# Patient Record
Sex: Male | Born: 1995
Health system: Southern US, Community
[De-identification: ages and names within clinical notes are randomized; demographics above are authoritative.]

## PROBLEM LIST (undated history)

## (undated) DIAGNOSIS — J302 Other seasonal allergic rhinitis: Secondary | ICD-10-CM

## (undated) DIAGNOSIS — L309 Dermatitis, unspecified: Secondary | ICD-10-CM

## (undated) DIAGNOSIS — F988 Other specified behavioral and emotional disorders with onset usually occurring in childhood and adolescence: Secondary | ICD-10-CM

## (undated) HISTORY — PX: ANKLE SURGERY: SHX546

---
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
## 2016-01-11 ENCOUNTER — Ambulatory Visit: Payer: Self-pay | Admitting: Allergy & Immunology

## 2016-05-02 ENCOUNTER — Emergency Department (HOSPITAL_COMMUNITY)
Admission: EM | Admit: 2016-05-02 | Discharge: 2016-05-02 | Disposition: A | Discharge status: Home / Self Care | Payer: No Typology Code available for payment source | Attending: Emergency Medicine | Admitting: Emergency Medicine

## 2016-05-02 DIAGNOSIS — R112 Nausea with vomiting, unspecified: Secondary | ICD-10-CM | POA: Insufficient documentation

## 2016-05-02 DIAGNOSIS — R0602 Shortness of breath: Secondary | ICD-10-CM | POA: Diagnosis present

## 2016-05-02 DIAGNOSIS — R197 Diarrhea, unspecified: Secondary | ICD-10-CM | POA: Insufficient documentation

## 2016-05-02 DIAGNOSIS — F909 Attention-deficit hyperactivity disorder, unspecified type: Secondary | ICD-10-CM | POA: Diagnosis not present

## 2016-05-02 DIAGNOSIS — J45901 Unspecified asthma with (acute) exacerbation: Secondary | ICD-10-CM | POA: Diagnosis not present

## 2016-05-02 LAB — CBC WITH DIFFERENTIAL/PLATELET
BASOS PCT: 0 %
Basophils Absolute: 0 10*3/uL (ref 0.0–0.1)
EOS ABS: 0 10*3/uL (ref 0.0–0.7)
EOS PCT: 1 %
HCT: 41.9 % (ref 39.0–52.0)
Hemoglobin: 14.8 g/dL (ref 13.0–17.0)
LYMPHS ABS: 0.5 10*3/uL — AB (ref 0.7–4.0)
Lymphocytes Relative: 8 %
MCH: 29.3 pg (ref 26.0–34.0)
MCHC: 35.3 g/dL (ref 30.0–36.0)
MCV: 83 fL (ref 78.0–100.0)
MONO ABS: 0.4 10*3/uL (ref 0.1–1.0)
MONOS PCT: 5 %
Neutro Abs: 5.7 10*3/uL (ref 1.7–7.7)
Neutrophils Relative %: 86 %
PLATELETS: 195 10*3/uL (ref 150–400)
RBC: 5.05 MIL/uL (ref 4.22–5.81)
RDW: 12.4 % (ref 11.5–15.5)
WBC: 6.7 10*3/uL (ref 4.0–10.5)

## 2016-05-02 LAB — COMPREHENSIVE METABOLIC PANEL
ALBUMIN: 4.6 g/dL (ref 3.5–5.0)
ALK PHOS: 86 U/L (ref 38–126)
ALT: 16 U/L — AB (ref 17–63)
AST: 21 U/L (ref 15–41)
Anion gap: 8 (ref 5–15)
BILIRUBIN TOTAL: 1.1 mg/dL (ref 0.3–1.2)
BUN: 17 mg/dL (ref 6–20)
CALCIUM: 9.2 mg/dL (ref 8.9–10.3)
CO2: 26 mmol/L (ref 22–32)
CREATININE: 0.99 mg/dL (ref 0.61–1.24)
Chloride: 104 mmol/L (ref 101–111)
GFR calc Af Amer: >60 mL/min (ref >60)
GFR calc non Af Amer: >60 mL/min (ref >60)
GLUCOSE: 120 mg/dL — AB (ref 65–99)
Potassium: 3.6 mmol/L (ref 3.5–5.1)
SODIUM: 138 mmol/L (ref 135–145)
TOTAL PROTEIN: 8.1 g/dL (ref 6.5–8.1)

## 2016-05-02 LAB — LIPASE, BLOOD: Lipase: 21 U/L (ref 11–51)

## 2016-05-02 MED ORDER — SODIUM CHLORIDE 0.9 % IV BOLUS (SEPSIS)
1000.0000 mL | Freq: Once | INTRAVENOUS | Status: AC
  Administered 2016-05-02: 1000 mL via INTRAVENOUS

## 2016-05-02 MED ORDER — IPRATROPIUM-ALBUTEROL 0.5-2.5 (3) MG/3ML IN SOLN
3.0000 mL | Freq: Once | RESPIRATORY_TRACT | Status: AC
  Administered 2016-05-02: 3 mL via RESPIRATORY_TRACT
  Filled 2016-05-02: qty 3

## 2016-05-02 MED ORDER — ALBUTEROL SULFATE HFA 108 (90 BASE) MCG/ACT IN AERS: 2.0000 | INHALATION_SPRAY | RESPIRATORY_TRACT | 0 refills | Status: DC | PRN

## 2016-05-02 MED ORDER — ACETAMINOPHEN 500 MG PO TABS
1000.0000 mg | ORAL_TABLET | Freq: Once | ORAL | Status: AC
  Administered 2016-05-02: 1000 mg via ORAL
  Filled 2016-05-02: qty 2

## 2016-05-02 MED ORDER — PREDNISONE 20 MG PO TABS
40.0000 mg | ORAL_TABLET | Freq: Every day | ORAL | 0 refills | Status: AC
Start: 2016-05-03 — End: 2016-05-07

## 2016-05-02 MED ORDER — ONDANSETRON HCL 4 MG PO TABS: 4.0000 mg | ORAL_TABLET | Freq: Four times a day (QID) | ORAL | 0 refills | Status: DC

## 2016-05-02 NOTE — Discharge Instructions (Signed)
We believe that your symptoms are caused today by an exacerbation of your asthma.  Please take the prescribed medications and any medications that you have at home.  Follow up with your doctor as recommended.  If you develop any new or worsening symptoms, including but not limited to fever, persistent vomiting, worsening shortness of breath, or other symptoms that concern you, please return to the Emergency Department immediately.  

## 2016-05-02 NOTE — ED Provider Notes (Signed)
Emergency Department Provider Note   I have reviewed the triage vital signs and the nursing notes.   HISTORY  Chief Complaint Shortness of Breath   HPI Eric Mills is a 20 y.o. male with PMH of asthma presents to the emergency department for evaluation of acute onset difficulty breathing. Patient states he was up most of the night with vomiting and diarrhea. During one episode of vomiting patient states he had difficulty breathing all of a sudden and eventually was able to get his breath. He states he felt like he almost passed out. No chest pain. No fevers. She has multiple sick contacts at work with similar GI illness symptoms. The patient called EMS since he was out of his albuterol inhalers and in acute respiratory distress. He was given Solu-Medrol, Zofran, albuterol in route and is feeling better.   Past Medical History:  Diagnosis Date  . Asthma   . Attention deficit disorder (ADD)     There are no active problems to display for this patient.   No past surgical history on file.  Current Outpatient Rx  . Order #: [id number] Class: Print  . Order #: [id number] Class: Print  . Order #: [id number] Class: Print  . Order #: [id number] Class: Print  . [START ON 05/03/2016] Order #: [id number] Class: Print    Allergies Fish allergy and Milk-related compounds  No family history on file.  Social History Social History  Substance Use Topics  . Smoking status: Never Smoker  . Smokeless tobacco: Not on file  . Alcohol use No    Review of Systems  Constitutional: No fever/chills Eyes: No visual changes. ENT: No sore throat. Cardiovascular: Denies chest pain. Respiratory: Denies shortness of breath. Gastrointestinal: No abdominal pain. Positive nausea, vomiting, and diarrhea.  No constipation. Genitourinary: Negative for dysuria. Musculoskeletal: Negative for back pain. Skin: Negative for rash. Neurological: Negative for headaches, focal weakness or  numbness.  10-point ROS otherwise negative.  ____________________________________________   PHYSICAL EXAM:  VITAL SIGNS: ED Triage Vitals [05/02/16 1114]  Enc Vitals Group     BP 143/72     Pulse Rate 104     Resp 18     Temp 98.4 F (36.9 C)     Temp Source Oral     SpO2 99 %   Constitutional: Alert and oriented. Well appearing and in no acute distress. Eyes: Conjunctivae are normal.  Head: Atraumatic. Nose: No congestion/rhinnorhea. Mouth/Throat: Mucous membranes are moist.  Neck: No stridor.   Cardiovascular: Normal rate, regular rhythm. Good peripheral circulation. Grossly normal heart sounds.   Respiratory: Normal respiratory effort.  No retractions. Lungs with faint expiratory wheezing throughout.  Gastrointestinal: Soft and nontender. No distention.  Musculoskeletal: No lower extremity tenderness nor edema. No gross deformities of extremities. Neurologic:  Normal speech and language. No gross focal neurologic deficits are appreciated.  Skin:  Skin is warm, dry and intact. No rash noted.  ____________________________________________   LABS (all labs ordered are listed, but only abnormal results are displayed)  Labs Reviewed  COMPREHENSIVE METABOLIC PANEL - Abnormal; Notable for the following:       Result Value   Glucose, Bld 120 (*)    ALT 16 (*)    All other components within normal limits  CBC WITH DIFFERENTIAL/PLATELET - Abnormal; Notable for the following:    Lymphs Abs 0.5 (*)    All other components within normal limits  LIPASE, BLOOD   ____________________________________________   PROCEDURES  Procedure(s) performed:   Procedures  None ____________________________________________   INITIAL  IMPRESSION / ASSESSMENT AND PLAN / ED COURSE  Pertinent labs & imaging results that were available during my care of the patient were reviewed by me and considered in my medical decision making (see chart for details).  Patient resents to the  emergency department for evaluation of difficulty breathing in the setting of large volume vomiting and diarrhea. Patient has a history of asthma and requires several hospital presentations per year. No recent admissions. Patient seems more comfortable after DuoNeb, Solu-Medrol, Zofran with EMS. He is asking to drink something. Patient has some mild tachycardia that is likely due to the albuterol nebulizer. Plan for repeat urine abdomen, labs, PO challenge.   Labs unremarkable. Patient tolerating PO without difficulty. Wheezing and subjective SOB symptoms improved. No clinical evidence to suggest PNA at this time. Plan for discharge home with zofran, steroids, and albuterol. Patient will follow with PCP.   At this time, I do not feel there is any life-threatening condition present. I have reviewed and discussed all results (EKG, imaging, lab, urine as appropriate), exam findings with patient. I have reviewed nursing notes and appropriate previous records.  I feel the patient is safe to be discharged home without further emergent workup. Discussed usual and customary return precautions. Patient and family (if present) verbalize understanding and are comfortable with this plan.  Patient will follow-up with their primary care provider. If they do not have a primary care provider, information for follow-up has been provided to them. All questions have been answered.  ____________________________________________  FINAL CLINICAL IMPRESSION(S) / ED DIAGNOSES  Final diagnoses:  Moderate asthma with exacerbation, unspecified whether persistent  Nausea, vomiting and diarrhea     MEDICATIONS GIVEN DURING THIS VISIT:  Medications  sodium chloride 0.9 % bolus 1,000 mL (0 mLs Intravenous Stopped 05/02/16 1432)  ipratropium-albuterol (DUONEB) 0.5-2.5 (3) MG/3ML nebulizer solution 3 mL (3 mLs Nebulization Given 05/02/16 1249)  acetaminophen (TYLENOL) tablet 1,000 mg (1,000 mg Oral Given 05/02/16 1439)      NEW OUTPATIENT MEDICATIONS STARTED DURING THIS VISIT:  Discharge Medication List as of 05/02/2016  2:32 PM    START taking these medications   Details  !! albuterol (PROVENTIL HFA;VENTOLIN HFA) 108 (90 Base) MCG/ACT inhaler Inhale 2 puffs into the lungs every 4 (four) hours as needed for wheezing or shortness of breath., Starting Thu 05/02/2016, Print    ondansetron (ZOFRAN) 4 MG tablet Take 1 tablet (4 mg total) by mouth every 6 (six) hours., Starting Thu 05/02/2016, Print    predniSONE (DELTASONE) 20 MG tablet Take 2 tablets (40 mg total) by mouth daily., Starting Fri 05/03/2016, Until Tue 05/07/2016, Print     !! - Potential duplicate medications found. Please discuss with provider.        Note:  This document was prepared using Dragon voice recognition software and may include unintentional dictation errors.  Alona BeneJoshua Davaughn Hillyard, MD Emergency Medicine   Maia PlanJoshua G Tsuyako Jolley, MD 05/02/16 1728

## 2016-05-02 NOTE — ED Notes (Signed)
Bed: WA06 Expected date:  Expected time:  Means of arrival:  Comments: EMS- 20yo M, SOB/n/v/Tx given

## 2016-05-02 NOTE — ED Triage Notes (Signed)
Per EMS, pt is coming from home with complaints of an asthma attack. Pt has the stomach virus and has had n/v/d for the last 24 hours. On arrival Pt was laying on right side having labored breathing and hard time completing sentences. Pt has a hx of asthma. 125 mg solu-medrol, 1 duo-neb, and 4 mg of zofran.

## 2016-05-02 NOTE — ED Notes (Signed)
Bed: WA07 Expected date:  Expected time:  Means of arrival:  Comments: 

## 2016-05-10 ENCOUNTER — Ambulatory Visit: Payer: Self-pay | Admitting: Allergy & Immunology

## 2016-07-14 ENCOUNTER — Emergency Department (HOSPITAL_COMMUNITY)
Admission: EM | Admit: 2016-07-14 | Discharge: 2016-07-14 | Disposition: A | Discharge status: Home / Self Care | Payer: No Typology Code available for payment source | Attending: Emergency Medicine | Admitting: Emergency Medicine

## 2016-07-14 ENCOUNTER — Emergency Department (HOSPITAL_COMMUNITY): Payer: No Typology Code available for payment source

## 2016-07-14 ENCOUNTER — Encounter (HOSPITAL_COMMUNITY): Payer: Self-pay

## 2016-07-14 DIAGNOSIS — F909 Attention-deficit hyperactivity disorder, unspecified type: Secondary | ICD-10-CM | POA: Insufficient documentation

## 2016-07-14 DIAGNOSIS — J45901 Unspecified asthma with (acute) exacerbation: Secondary | ICD-10-CM | POA: Insufficient documentation

## 2016-07-14 DIAGNOSIS — R05 Cough: Secondary | ICD-10-CM | POA: Insufficient documentation

## 2016-07-14 DIAGNOSIS — Z79899 Other long term (current) drug therapy: Secondary | ICD-10-CM | POA: Insufficient documentation

## 2016-07-14 DIAGNOSIS — R059 Cough, unspecified: Secondary | ICD-10-CM

## 2016-07-14 MED ORDER — IPRATROPIUM-ALBUTEROL 0.5-2.5 (3) MG/3ML IN SOLN
3.0000 mL | Freq: Once | RESPIRATORY_TRACT | Status: AC
  Administered 2016-07-14: 3 mL via RESPIRATORY_TRACT
  Filled 2016-07-14: qty 3

## 2016-07-14 MED ORDER — ALBUTEROL SULFATE HFA 108 (90 BASE) MCG/ACT IN AERS: 1.0000 | INHALATION_SPRAY | Freq: Four times a day (QID) | RESPIRATORY_TRACT | 0 refills | Status: DC | PRN

## 2016-07-14 MED ORDER — ALBUTEROL SULFATE (2.5 MG/3ML) 0.083% IN NEBU
5.0000 mg | INHALATION_SOLUTION | Freq: Once | RESPIRATORY_TRACT | Status: AC
  Administered 2016-07-14: 5 mg via RESPIRATORY_TRACT

## 2016-07-14 MED ORDER — ALBUTEROL SULFATE (2.5 MG/3ML) 0.083% IN NEBU
INHALATION_SOLUTION | RESPIRATORY_TRACT | Status: AC
  Filled 2016-07-14: qty 6

## 2016-07-14 MED ORDER — PREDNISONE 20 MG PO TABS
60.0000 mg | ORAL_TABLET | Freq: Once | ORAL | Status: AC
  Administered 2016-07-14: 60 mg via ORAL
  Filled 2016-07-14: qty 3

## 2016-07-14 MED ORDER — ALBUTEROL SULFATE (2.5 MG/3ML) 0.083% IN NEBU: 2.5000 mg | INHALATION_SOLUTION | Freq: Four times a day (QID) | RESPIRATORY_TRACT | 12 refills | Status: DC | PRN

## 2016-07-14 MED ORDER — PREDNISONE 20 MG PO TABS: 40.0000 mg | ORAL_TABLET | Freq: Every day | ORAL | 0 refills | Status: DC

## 2016-07-14 NOTE — ED Provider Notes (Signed)
[facility]PT Provider Note   CSN: [id number] Arrival date & time: 07/14/16  4098     History   Chief Complaint Chief Complaint  Patient presents with  . asthma exacerbation    HPI Eric Mills is a 21 y.o. male.  The history is provided by the patient and medical records. No language interpreter was used.   Eric Mills is a 21 y.o. male  with a PMH of asthma who presents to the Emergency Department complaining of chest tightness 3-4 days associated with dry cough and nasal congestion. He has been using his Symbicort inhaler daily as well as rescue inhaler multiple times throughout the day with little relief in symptoms. Typically uses home nebulizer when he feels this way, however he is out of albuterol nebs. Ibuprofen taken as needed for chest pain with some relief. 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 Denies fever or chills.    Past Medical History:  Diagnosis Date  . Asthma   . Attention deficit disorder (ADD)     There are no active problems to display for this patient.   History reviewed. No pertinent surgical history.     Home Medications    Prior to Admission medications   Medication Sig Start Date End Date Taking? Authorizing Provider  albuterol (PROVENTIL HFA;VENTOLIN HFA) 108 (90 Base) MCG/ACT inhaler Inhale 1-2 puffs into the lungs every 6 (six) hours as needed for wheezing or shortness of breath. 07/14/16   Chase Picket Shanitra Phillippi, PA-C  albuterol (PROVENTIL) (2.5 MG/3ML) 0.083% nebulizer solution Take 3 mLs (2.5 mg total) by nebulization every 6 (six) hours as needed for wheezing or shortness of breath. 07/14/16   Perrin Gens Pilcher Monte Zinni, PA-C  budesonide-formoterol (SYMBICORT) 80-4.5 MCG/ACT inhaler Inhale 2 puffs into the lungs 2 (two) times daily. Patient not taking: Reported on 05/02/2016 11/02/15   Marily Memos, MD  ondansetron (ZOFRAN) 4 MG tablet Take 1 tablet (4 mg total) by mouth every 6  (six) hours. 05/02/16   Maia Plan, MD  predniSONE (DELTASONE) 20 MG tablet Take 2 tablets (40 mg total) by mouth daily. 07/14/16   Chase Picket Darral Rishel, PA-C    Family History No family history on file.  Social History Social History  Substance Use Topics  . Smoking status: Never Smoker  . Smokeless tobacco: Not on file  . Alcohol use No     Allergies   Fish allergy and Milk-related compounds   Review of Systems Review of Systems  Constitutional: Negative for chills and fever.  HENT: Positive for congestion.   Eyes: Negative for visual disturbance.  Respiratory: Positive for cough, chest tightness and wheezing. Negative for shortness of breath.   Cardiovascular: Negative.   Gastrointestinal: Negative for abdominal pain, nausea and vomiting.  Genitourinary: Negative for dysuria.  Musculoskeletal: Negative for back pain and neck pain.  Skin: Negative for rash.  Neurological: Negative for headaches.     Physical Exam Updated Vital Signs BP 121/57   Pulse (!) 52   Temp 97.8 F (36.6 C) (Oral)   Resp 20   SpO2 100%   Physical Exam  Constitutional: He is oriented to person, place, and time. He appears well-developed and well-nourished. No distress.  HENT:  Head: Normocephalic and atraumatic.  Cardiovascular: Normal rate, regular rhythm and normal heart sounds.   No murmur heard. Pulmonary/Chest: Effort normal. No respiratory distress. He has wheezes.  Expiratory wheezing bilaterally. Speaking in full sentences. 100% O2  on RA.   Abdominal: Soft. He exhibits no distension. There is no tenderness.  Musculoskeletal: He exhibits no edema.  Neurological: He is alert and oriented to person, place, and time.  Skin: Skin is warm and dry.  Nursing note and vitals reviewed.    ED Treatments / Results  Labs (all labs ordered are listed, but only abnormal results are displayed) Labs Reviewed - No data to display  EKG  EKG Interpretation None       Radiology Dg  Chest 2 View  Result Date: 07/14/2016 CLINICAL DATA:  Cough and congestion with chest tightness for 3 days. History of asthma. EXAM: CHEST  2 VIEW COMPARISON:  None. FINDINGS: The cardiomediastinal silhouette is unremarkable. Mild peribronchial thickening is unchanged. There is no evidence of focal airspace disease, pulmonary edema, suspicious pulmonary nodule/mass, pleural effusion, or pneumothorax. No acute bony abnormalities are identified. IMPRESSION: No active cardiopulmonary disease. Electronically Signed   By: Harmon PierJeffrey  Hu M.D.   On: 07/14/2016 11:07    Procedures Procedures (including critical care time)  Medications Ordered in ED Medications  albuterol (PROVENTIL) (2.5 MG/3ML) 0.083% nebulizer solution (not administered)  albuterol (PROVENTIL) (2.5 MG/3ML) 0.083% nebulizer solution 5 mg (5 mg Nebulization Given 07/14/16 0935)  ipratropium-albuterol (DUONEB) 0.5-2.5 (3) MG/3ML nebulizer solution 3 mL (3 mLs Nebulization Given 07/14/16 1124)  predniSONE (DELTASONE) tablet 60 mg (60 mg Oral Given 07/14/16 1124)     Initial Impression / Assessment and Plan / ED Course  I have reviewed the triage vital signs and the nursing notes.  Pertinent labs & imaging results that were available during my care of the patient were reviewed by me and considered in my medical decision making (see chart for details).    Eric Mills is a 21 y.o. male who presents to ED for chest tightness, cough, congestion c/w asthma exacerbation. On exam, patient is afebrile, speaking in full sentences with expiratory wheezing bilaterally. One duoneb given in triage. Will obtain CXR and give duoneb/steroids then reassess.   CXR negative.  11:53 AM - Patient reevaluated following second nebulizer treatment. Wheezing nearly resolved. Patient feels much improved and comfortable with discharge to home. Will refill albuterol nebs and rescue inhaler. Rx for steroid burst. Return precautions discussed with patient and mother  at bedside. PCP follow up if symptoms persist. All questions answered.    Final Clinical Impressions(s) / ED Diagnoses   Final diagnoses:  Cough  Moderate asthma with acute exacerbation, unspecified whether persistent    New Prescriptions New Prescriptions   ALBUTEROL (PROVENTIL HFA;VENTOLIN HFA) 108 (90 BASE) MCG/ACT INHALER    Inhale 1-2 puffs into the lungs every 6 (six) hours as needed for wheezing or shortness of breath.   ALBUTEROL (PROVENTIL) (2.5 MG/3ML) 0.083% NEBULIZER SOLUTION    Take 3 mLs (2.5 mg total) by nebulization every 6 (six) hours as needed for wheezing or shortness of breath.   PREDNISONE (DELTASONE) 20 MG TABLET    Take 2 tablets (40 mg total) by mouth daily.     [facility] County Health CenterJaime Pilcher Evelin Cake, PA-C 07/14/16 1204    Tilden FossaElizabeth Rees, MD 07/20/16 [phone]

## 2016-07-14 NOTE — Discharge Instructions (Signed)
Take prednisone daily starting tomorrow morning. Continue Symbicort inhaler daily. I have refilled your rescue inhaler and nebulizer treatments. Use every 4 hours as needed. Follow-up with your primary care provider if symptoms do not improve. Return to ER for new or worsening symptoms, any additional concerns.

## 2016-07-14 NOTE — ED Triage Notes (Signed)
Patient complains of asthma exacerbation x 3 days. Increased cough and congestion with chest tightness, last used inhaler last night. Speaking complete sentences

## 2016-08-07 ENCOUNTER — Encounter (HOSPITAL_COMMUNITY): Payer: Self-pay

## 2016-08-07 ENCOUNTER — Emergency Department (HOSPITAL_COMMUNITY)
Admission: EM | Admit: 2016-08-07 | Discharge: 2016-08-07 | Disposition: A | Discharge status: Home / Self Care | Payer: No Typology Code available for payment source | Attending: Emergency Medicine | Admitting: Emergency Medicine

## 2016-08-07 DIAGNOSIS — J302 Other seasonal allergic rhinitis: Secondary | ICD-10-CM

## 2016-08-07 DIAGNOSIS — J3089 Other allergic rhinitis: Secondary | ICD-10-CM | POA: Insufficient documentation

## 2016-08-07 DIAGNOSIS — Z79899 Other long term (current) drug therapy: Secondary | ICD-10-CM | POA: Insufficient documentation

## 2016-08-07 DIAGNOSIS — R0981 Nasal congestion: Secondary | ICD-10-CM

## 2016-08-07 MED ORDER — ALBUTEROL SULFATE HFA 108 (90 BASE) MCG/ACT IN AERS
2.0000 | INHALATION_SPRAY | Freq: Once | RESPIRATORY_TRACT | Status: AC
Start: 2016-08-07 — End: 2016-08-07
  Administered 2016-08-07: 2 via RESPIRATORY_TRACT
  Filled 2016-08-07: qty 6.7

## 2016-08-07 MED ORDER — DEXAMETHASONE 4 MG PO TABS
10.0000 mg | ORAL_TABLET | Freq: Once | ORAL | Status: AC
  Administered 2016-08-07: 10 mg via ORAL
  Filled 2016-08-07: qty 3

## 2016-08-07 NOTE — ED Triage Notes (Signed)
Patient complains of congestion and cough x 1 day, states that he has seasonal allergies and using inhaler for asthma. No wheezing, no distress on arrival

## 2016-08-07 NOTE — ED Notes (Signed)
Patient complaining of nasal congestion with sore throat.  States that he feels like his asthma is acting up.  Lungs clear.

## 2016-08-07 NOTE — ED Provider Notes (Signed)
[facility] Provider Note   CSN: [id number] Arrival date & time: 08/07/16  1023  By signing my name below, I, [name], attest that this documentation has been prepared under the direction and in the presence of Nira Conn, MD . Electronically Signed: [name], ED Scribe. 08/07/2016. 12:37 PM.   History   Chief Complaint Chief Complaint  Patient presents with  . Nasal Congestion   The history is provided by the patient and medical records. No language interpreter was used.    HPI Comments:  Eric Mills is a 21 y.o. male who presents to the Emergency Department complaining of constant nasal congestion x 2 days. Pt complains of associated SOB, dry cough, sore throat. Pt reports seasonal allergies, and states that he has been using 2 doses of an albuterol inhaler for asthma every 2 hours with mild relief. Pt is prescribed prednisone but has not filled the prescription because his medicaid ended. He is requesting a refill for his inhaler. Pt reports possible sick contacts. Denies wheezing, fever.    Past Medical History:  Diagnosis Date  . Asthma   . Attention deficit disorder (ADD)    There are no active problems to display for this patient.  History reviewed. No pertinent surgical history.  Home Medications    Prior to Admission medications   Medication Sig Start Date End Date Taking? Authorizing Provider  albuterol (PROVENTIL HFA;VENTOLIN HFA) 108 (90 Base) MCG/ACT inhaler Inhale 1-2 puffs into the lungs every 6 (six) hours as needed for wheezing or shortness of breath. 07/14/16   Chase Picket Ward, PA-C  albuterol (PROVENTIL) (2.5 MG/3ML) 0.083% nebulizer solution Take 3 mLs (2.5 mg total) by nebulization every 6 (six) hours as needed for wheezing or shortness of breath. 07/14/16   Jaime Pilcher Ward, PA-C  budesonide-formoterol (SYMBICORT) 80-4.5 MCG/ACT inhaler Inhale 2 puffs into the lungs 2 (two) times daily. Patient not taking: Reported  on 05/02/2016 11/02/15   Marily Memos, MD  ondansetron (ZOFRAN) 4 MG tablet Take 1 tablet (4 mg total) by mouth every 6 (six) hours. 05/02/16   Maia Plan, MD  predniSONE (DELTASONE) 20 MG tablet Take 2 tablets (40 mg total) by mouth daily. 07/14/16   Chase Picket Ward, PA-C   Family History No family history on file.  Social History Social History  Substance Use Topics  . Smoking status: Never Smoker  . Smokeless tobacco: Not on file  . Alcohol use No   Allergies   Fish allergy and Milk-related compounds   Review of Systems Review of Systems  10 Systems reviewed and are negative for acute change except as noted in the HPI.    Physical Exam Updated Vital Signs BP 140/71   Pulse (!) 56   Temp 97.9 F (36.6 C)   Resp 18   SpO2 99%   Physical Exam  Constitutional: He is oriented to person, place, and time. He appears well-developed and well-nourished. No distress.  HENT:  Head: Normocephalic and atraumatic.  Right Ear: Tympanic membrane normal.  Left Ear: Tympanic membrane normal.  Nose: Nose normal.  Mouth/Throat: No posterior oropharyngeal erythema. No tonsillar exudate.  Nasal congestion bilaterally, and post-nasal drip noted.   Eyes: Conjunctivae and EOM are normal. Pupils are equal, round, and reactive to light. Right eye exhibits no discharge. Left eye exhibits no discharge. No scleral icterus.  Neck: Normal range of motion. Neck supple.  Cardiovascular: Normal rate and regular rhythm.  Exam reveals no gallop and no friction rub.  No murmur heard. Pulmonary/Chest: Effort normal and breath sounds normal. No stridor. No respiratory distress. He has no wheezes. He has no rales.  Abdominal: Soft. He exhibits no distension. There is no tenderness.  Musculoskeletal: He exhibits no edema or tenderness.  Neurological: He is alert and oriented to person, place, and time.  Skin: Skin is warm and dry. No rash noted. He is not diaphoretic. No erythema.  Psychiatric: He  has a normal mood and affect.  Vitals reviewed.     ED Treatments / Results  DIAGNOSTIC STUDIES:  Oxygen Saturation is 99% on RA, normal by my interpretation.    COORDINATION OF CARE:  12:35 PM Will order steroid injection. Discussed treatment plan with pt at bedside and pt agreed to plan.  Labs (all labs ordered are listed, but only abnormal results are displayed) Labs Reviewed - No data to display  EKG  EKG Interpretation None       Radiology No results found.  Procedures Procedures (including critical care time)  Medications Ordered in ED Medications  albuterol (PROVENTIL HFA;VENTOLIN HFA) 108 (90 Base) MCG/ACT inhaler 2 puff (not administered)  dexamethasone (DECADRON) tablet 10 mg (not administered)     Initial Impression / Assessment and Plan / ED Course  I have reviewed the triage vital signs and the nursing notes.  Pertinent labs & imaging results that were available during my care of the patient were reviewed by me and considered in my medical decision making (see chart for details).     Consistent with likely allergic rhinitis. No wheezing, increased WOB, or decreased air movement noted to suggest asthma exacerbation. Given his reported increased use of albuterol, will provide patient with 10 mg of Decadron. We'll also provide patient with albuterol inhaler and he is running out. Low suspicion for infectious source. No indication of CXR at this time.  The patient is safe for discharge with strict return precautions.   Final Clinical Impressions(s) / ED Diagnoses   Final diagnoses:  Acute seasonal allergic rhinitis due to other allergen  Nasal congestion   Disposition: Discharge  Condition: Good  I have discussed the results, Dx and Tx plan with the patient who expressed understanding and agree(s) with the plan. Discharge instructions discussed at great length. The patient was given strict return precautions who verbalized understanding of the  instructions. No further questions at time of discharge.    New Prescriptions   No medications on file    Follow Up: Rosario Adie, MD [phone]  Schedule an appointment as soon as possible for a visit  As needed   I personally performed the services described in this documentation, which was scribed in my presence. The recorded information has been reviewed and is accurate.        Nira Conn, MD 08/07/16 1248

## 2016-09-28 ENCOUNTER — Emergency Department (HOSPITAL_COMMUNITY): Payer: Medicaid Other

## 2016-09-28 ENCOUNTER — Encounter (HOSPITAL_COMMUNITY): Payer: Self-pay

## 2016-09-28 ENCOUNTER — Emergency Department (HOSPITAL_COMMUNITY)
Admission: EM | Admit: 2016-09-28 | Discharge: 2016-09-28 | Disposition: A | Discharge status: Home / Self Care | Payer: Medicaid Other | Attending: Emergency Medicine | Admitting: Emergency Medicine

## 2016-09-28 DIAGNOSIS — X509XXA Other and unspecified overexertion or strenuous movements or postures, initial encounter: Secondary | ICD-10-CM | POA: Insufficient documentation

## 2016-09-28 DIAGNOSIS — Y9367 Activity, basketball: Secondary | ICD-10-CM | POA: Insufficient documentation

## 2016-09-28 DIAGNOSIS — Z79899 Other long term (current) drug therapy: Secondary | ICD-10-CM | POA: Insufficient documentation

## 2016-09-28 DIAGNOSIS — F909 Attention-deficit hyperactivity disorder, unspecified type: Secondary | ICD-10-CM | POA: Insufficient documentation

## 2016-09-28 DIAGNOSIS — S93491A Sprain of other ligament of right ankle, initial encounter: Secondary | ICD-10-CM

## 2016-09-28 DIAGNOSIS — Y998 Other external cause status: Secondary | ICD-10-CM | POA: Insufficient documentation

## 2016-09-28 DIAGNOSIS — J45909 Unspecified asthma, uncomplicated: Secondary | ICD-10-CM | POA: Insufficient documentation

## 2016-09-28 DIAGNOSIS — Y929 Unspecified place or not applicable: Secondary | ICD-10-CM | POA: Insufficient documentation

## 2016-09-28 MED ORDER — ACETAMINOPHEN 500 MG PO TABS
1000.0000 mg | ORAL_TABLET | Freq: Once | ORAL | Status: AC
  Administered 2016-09-28: 1000 mg via ORAL
  Filled 2016-09-28: qty 2

## 2016-09-28 MED ORDER — IBUPROFEN 400 MG PO TABS
600.0000 mg | ORAL_TABLET | Freq: Once | ORAL | Status: AC
  Administered 2016-09-28: 600 mg via ORAL
  Filled 2016-09-28: qty 1

## 2016-09-28 NOTE — ED Provider Notes (Signed)
[facility] Provider Note   CSN: [id number] Arrival date & time: 09/28/16  1943     History   Chief Complaint Chief Complaint  Patient presents with  . Ankle Pain    HPI Eric Mills is a 21 y.o. male.  Patient reports that yesterday he was playing basketball. Forcefully stepped on his right ankle. Immediate onset of pain and ankle as well as middle of his lower leg. Persistence of pain so presented here. Not fall and hit his head, did not lose consciousness. No other pain at this time.   The history is provided by the patient.  Illness  This is a new problem. The current episode started yesterday. The problem occurs constantly. The problem has not changed since onset.Pertinent negatives include no chest pain, no abdominal pain, no headaches and no shortness of breath. He has tried nothing for the symptoms.    Past Medical History:  Diagnosis Date  . Asthma   . Attention deficit disorder (ADD)     There are no active problems to display for this patient.   Past Surgical History:  Procedure Laterality Date  . ANKLE SURGERY         Home Medications    Prior to Admission medications   Medication Sig Start Date End Date Taking? Authorizing Provider  albuterol (PROVENTIL HFA;VENTOLIN HFA) 108 (90 Base) MCG/ACT inhaler Inhale 1-2 puffs into the lungs every 6 (six) hours as needed for wheezing or shortness of breath. 07/14/16   Ward, Chase Picket, PA-C  albuterol (PROVENTIL) (2.5 MG/3ML) 0.083% nebulizer solution Take 3 mLs (2.5 mg total) by nebulization every 6 (six) hours as needed for wheezing or shortness of breath. 07/14/16   Ward, Chase Picket, PA-C  budesonide-formoterol (SYMBICORT) 80-4.5 MCG/ACT inhaler Inhale 2 puffs into the lungs 2 (two) times daily. Patient not taking: Reported on 05/02/2016 11/02/15   Mesner, Barbara Cower, MD  ondansetron (ZOFRAN) 4 MG tablet Take 1 tablet (4 mg total) by mouth every 6 (six) hours. 05/02/16   Long, Arlyss Repress, MD  predniSONE  (DELTASONE) 20 MG tablet Take 2 tablets (40 mg total) by mouth daily. 07/14/16   Ward, Chase Picket, PA-C    Family History No family history on file.  Social History Social History  Substance Use Topics  . Smoking status: Never Smoker  . Smokeless tobacco: Never Used  . Alcohol use No     Allergies   Fish allergy and Milk-related compounds   Review of Systems Review of Systems  Respiratory: Negative for shortness of breath.   Cardiovascular: Negative for chest pain.  Gastrointestinal: Negative for abdominal pain.  Musculoskeletal: Negative for back pain and neck pain.  Skin: Negative for wound.  Neurological: Negative for headaches.       No LOC     Physical Exam Updated Vital Signs BP 124/70   Pulse 78   Temp 97.3 F (36.3 C)   Resp 20   SpO2 100%   Physical Exam  Constitutional: He appears well-developed and well-nourished.  HENT:  Head: Normocephalic and atraumatic.  Eyes: Conjunctivae are normal.  Neck: Neck supple.  Cardiovascular: Normal rate and regular rhythm.   No murmur heard. Pulmonary/Chest: Effort normal and breath sounds normal. No respiratory distress.  Abdominal: Soft. There is no tenderness.  Musculoskeletal: He exhibits no edema.  Right lower extremity with tenderness to palpation in the lower leg as well as the ankle. Some decreased range of motion secondary to pain. No open wounds. Mild swelling in the ankle. Tenderness  primarily over the lateral malleolus. No tenderness to palpation over the fifth metatarsal. No tenderness to palpation in the pelvis or the hip. Neurovascularly intact.  Neurological: He is alert.  Skin: Skin is warm and dry.  Psychiatric: He has a normal mood and affect.  Nursing note and vitals reviewed.    ED Treatments / Results  Labs (all labs ordered are listed, but only abnormal results are displayed) Labs Reviewed - No data to display  EKG  EKG Interpretation None       Radiology Dg Tibia/fibula  Right  Result Date: 09/28/2016 CLINICAL DATA:  Basketball injury to right lower leg; Hx of R ankle fracture several years ago; Leg is mostly painful distally near ankle EXAM: RIGHT TIBIA AND FIBULA - 2 VIEW COMPARISON:  None. FINDINGS: There is no evidence of acute fracture or other focal bone lesions. Tiny old avulsion fracture fragment is seen along the inferior margin of the lateral malleolus. Ankle mortise is symmetric. Soft tissues are unremarkable. IMPRESSION: No acute findings. Electronically Signed   By: Bary Richard M.D.   On: 09/28/2016 21:58   Dg Ankle Complete Right  Result Date: 09/28/2016 CLINICAL DATA:  Fall wall playing basketball with ankle pain, initial encounter EXAM: RIGHT ANKLE - COMPLETE 3+ VIEW COMPARISON:  None. FINDINGS: No acute fracture dislocation is noted. Small bony densities are noted adjacent to the talus distally an the navicular bone. This may be related to prior trauma. A small well corticated bony density is noted laterally and unchanged from the prior exam consistent with prior trauma. Distal tibia and fibula are within normal limits. No soft tissue changes are seen. IMPRESSION: No acute abnormality noted.  Changes of prior trauma. Electronically Signed   By: Alcide Clever M.D.   On: 09/28/2016 20:35   Dg Knee Complete 4 Views Right  Result Date: 09/28/2016 CLINICAL DATA:  Fall wall playing basketball with right knee pain, initial encounter EXAM: RIGHT KNEE - COMPLETE 4+ VIEW COMPARISON:  None. FINDINGS: No evidence of fracture, dislocation, or joint effusion. No evidence of arthropathy or other focal bone abnormality. Soft tissues are unremarkable. IMPRESSION: No acute abnormality noted. Electronically Signed   By: Alcide Clever M.D.   On: 09/28/2016 20:36    Procedures Procedures (including critical care time)  Medications Ordered in ED Medications  ibuprofen (ADVIL,MOTRIN) tablet 600 mg (600 mg Oral Given 09/28/16 2139)  acetaminophen (TYLENOL) tablet 1,000 mg  (1,000 mg Oral Given 09/28/16 2139)     Initial Impression / Assessment and Plan / ED Course  I have reviewed the triage vital signs and the nursing notes.  Pertinent labs & imaging results that were available during my care of the patient were reviewed by me and considered in my medical decision making (see chart for details).     Physical diagnosis for the patient is an ankle sprain. Tenderness to palpation is primarily over the distribution of the ATFL. He has no open wounds. Neurovascularly intact. X-rays of the patient's knee, tib/fib, ankle obtained without any evidence of fracture. He has no tenderness to palpation over his foot. Doubt fifth metatarsal fracture. Doubt Lisfranc. Gave the patient some ibuprofen and Tylenol here with improvement in symptoms. We'll put him in an air splint and given crutches. Weightbearing as tolerated. Encouraged him to use rice therapy. Told to follow up with his primary care doctor.  Final Clinical Impressions(s) / ED Diagnoses   Final diagnoses:  Sprain of anterior talofibular ligament of right ankle, initial encounter  New Prescriptions New Prescriptions   No medications on file     Lindalou Hose'Rourke, Dmiyah Liscano, MD 09/28/16 2203    Cathren LaineSteinl, Kevin, MD 09/28/16 2224

## 2016-09-28 NOTE — ED Triage Notes (Signed)
Pt states that he was playing basketball yesterday and came down on his R ankle wrong and has pain from R ankle up his calf, pt had surgery on r ankle two years ago.

## 2016-09-28 NOTE — Progress Notes (Signed)
Orthopedic Tech Progress Note Patient Details:  Eric Mills 11/03/1995 [id number]  Ortho Devices Type of Ortho Device: Ankle Air splint, Crutches Ortho Device/Splint Location: RLE Ortho Device/Splint Interventions: Ordered, Application, Adjustment   Jennye MoccasinHughes, Makynlie Rossini Craig 09/28/2016, 10:25 PM

## 2016-09-28 NOTE — ED Notes (Signed)
Patient transported to X-ray 

## 2016-12-30 DIAGNOSIS — R0789 Other chest pain: Secondary | ICD-10-CM | POA: Diagnosis not present

## 2016-12-30 DIAGNOSIS — Y92410 Unspecified street and highway as the place of occurrence of the external cause: Secondary | ICD-10-CM | POA: Diagnosis not present

## 2016-12-30 DIAGNOSIS — M549 Dorsalgia, unspecified: Secondary | ICD-10-CM | POA: Insufficient documentation

## 2016-12-30 DIAGNOSIS — Y999 Unspecified external cause status: Secondary | ICD-10-CM | POA: Insufficient documentation

## 2016-12-30 DIAGNOSIS — Y9389 Activity, other specified: Secondary | ICD-10-CM | POA: Diagnosis not present

## 2016-12-30 DIAGNOSIS — J4521 Mild intermittent asthma with (acute) exacerbation: Secondary | ICD-10-CM | POA: Insufficient documentation

## 2016-12-30 DIAGNOSIS — M25571 Pain in right ankle and joints of right foot: Secondary | ICD-10-CM | POA: Insufficient documentation

## 2016-12-31 ENCOUNTER — Emergency Department (HOSPITAL_COMMUNITY): Payer: No Typology Code available for payment source

## 2016-12-31 ENCOUNTER — Emergency Department (HOSPITAL_COMMUNITY)
Admission: EM | Admit: 2016-12-31 | Discharge: 2016-12-31 | Disposition: A | Discharge status: Home / Self Care | Payer: No Typology Code available for payment source | Attending: Emergency Medicine | Admitting: Emergency Medicine

## 2016-12-31 ENCOUNTER — Encounter (HOSPITAL_COMMUNITY): Payer: Self-pay

## 2016-12-31 DIAGNOSIS — R0789 Other chest pain: Secondary | ICD-10-CM

## 2016-12-31 DIAGNOSIS — J4521 Mild intermittent asthma with (acute) exacerbation: Secondary | ICD-10-CM

## 2016-12-31 DIAGNOSIS — M549 Dorsalgia, unspecified: Secondary | ICD-10-CM

## 2016-12-31 HISTORY — DX: Other seasonal allergic rhinitis: J30.2

## 2016-12-31 MED ORDER — IBUPROFEN 800 MG PO TABS
800.0000 mg | ORAL_TABLET | Freq: Once | ORAL | Status: AC
  Administered 2016-12-31: 800 mg via ORAL
  Filled 2016-12-31: qty 1

## 2016-12-31 MED ORDER — ALBUTEROL SULFATE (2.5 MG/3ML) 0.083% IN NEBU
5.0000 mg | INHALATION_SOLUTION | Freq: Once | RESPIRATORY_TRACT | Status: AC
  Administered 2016-12-31: 5 mg via RESPIRATORY_TRACT
  Filled 2016-12-31: qty 6

## 2016-12-31 MED ORDER — IPRATROPIUM-ALBUTEROL 0.5-2.5 (3) MG/3ML IN SOLN
3.0000 mL | Freq: Once | RESPIRATORY_TRACT | Status: AC
  Administered 2016-12-31: 3 mL via RESPIRATORY_TRACT
  Filled 2016-12-31: qty 3

## 2016-12-31 MED ORDER — ALBUTEROL SULFATE HFA 108 (90 BASE) MCG/ACT IN AERS
2.0000 | INHALATION_SPRAY | Freq: Once | RESPIRATORY_TRACT | Status: AC
  Administered 2016-12-31: 2 via RESPIRATORY_TRACT
  Filled 2016-12-31: qty 6.7

## 2016-12-31 MED ORDER — PREDNISONE 20 MG PO TABS
60.0000 mg | ORAL_TABLET | Freq: Once | ORAL | Status: AC
  Administered 2016-12-31: 60 mg via ORAL
  Filled 2016-12-31: qty 3

## 2016-12-31 MED ORDER — PREDNISONE 20 MG PO TABS: 60.0000 mg | ORAL_TABLET | Freq: Every day | ORAL | 0 refills | Status: DC

## 2016-12-31 NOTE — ED Notes (Signed)
Breathing treatment in progress/RT at bedside
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
## 2016-12-31 NOTE — Discharge Instructions (Signed)
You may alternate Tylenol 1000 mg every 6 hours as needed for pain and Ibuprofen 800 mg every 8 hours as needed for pain.  Please take Ibuprofen with food. ° °

## 2017-01-02 ENCOUNTER — Encounter (HOSPITAL_COMMUNITY): Payer: Self-pay | Admitting: Emergency Medicine

## 2017-01-02 ENCOUNTER — Emergency Department (HOSPITAL_COMMUNITY)
Admission: EM | Admit: 2017-01-02 | Discharge: 2017-01-02 | Disposition: A | Discharge status: Home / Self Care | Payer: Self-pay | Attending: Emergency Medicine | Admitting: Emergency Medicine

## 2017-01-02 ENCOUNTER — Emergency Department (HOSPITAL_COMMUNITY): Payer: Self-pay

## 2017-01-02 DIAGNOSIS — J4520 Mild intermittent asthma, uncomplicated: Secondary | ICD-10-CM | POA: Insufficient documentation

## 2017-01-02 DIAGNOSIS — J302 Other seasonal allergic rhinitis: Secondary | ICD-10-CM | POA: Insufficient documentation

## 2017-01-02 DIAGNOSIS — Z79899 Other long term (current) drug therapy: Secondary | ICD-10-CM | POA: Insufficient documentation

## 2017-01-02 MED ORDER — IPRATROPIUM-ALBUTEROL 0.5-2.5 (3) MG/3ML IN SOLN
3.0000 mL | Freq: Once | RESPIRATORY_TRACT | Status: AC
  Administered 2017-01-02: 3 mL via RESPIRATORY_TRACT
  Filled 2017-01-02: qty 3

## 2017-01-02 MED ORDER — ALBUTEROL SULFATE (2.5 MG/3ML) 0.083% IN NEBU: 5.0000 mg | INHALATION_SOLUTION | Freq: Once | RESPIRATORY_TRACT | Status: DC

## 2017-01-02 NOTE — ED Notes (Signed)
Gave report to Lisa, RN

## 2017-01-02 NOTE — Discharge Instructions (Signed)
Use your inhaler and neb treatments as needed. Continue the prednisone. Follow up with your doctor. Return here if symptoms worsen.
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
## 2017-01-09 ENCOUNTER — Ambulatory Visit: Payer: Self-pay | Admitting: Allergy & Immunology

## 2017-01-16 ENCOUNTER — Emergency Department (HOSPITAL_COMMUNITY)
Admission: EM | Admit: 2017-01-16 | Discharge: 2017-01-16 | Disposition: A | Discharge status: Home / Self Care | Payer: No Typology Code available for payment source | Attending: Emergency Medicine | Admitting: Emergency Medicine

## 2017-01-16 ENCOUNTER — Encounter (HOSPITAL_COMMUNITY): Payer: Self-pay | Admitting: *Deleted

## 2017-01-16 ENCOUNTER — Emergency Department (HOSPITAL_COMMUNITY): Payer: No Typology Code available for payment source

## 2017-01-16 DIAGNOSIS — Z79899 Other long term (current) drug therapy: Secondary | ICD-10-CM | POA: Diagnosis not present

## 2017-01-16 DIAGNOSIS — J4521 Mild intermittent asthma with (acute) exacerbation: Secondary | ICD-10-CM | POA: Insufficient documentation

## 2017-01-16 DIAGNOSIS — R0602 Shortness of breath: Secondary | ICD-10-CM | POA: Diagnosis present

## 2017-01-16 LAB — I-STAT CHEM 8, ED
BUN: 17 mg/dL (ref 6–20)
CALCIUM ION: 1.14 mmol/L — AB (ref 1.15–1.40)
CHLORIDE: 102 mmol/L (ref 101–111)
CREATININE: 1.2 mg/dL (ref 0.61–1.24)
Glucose, Bld: 102 mg/dL — ABNORMAL HIGH (ref 65–99)
HCT: 41 % (ref 39.0–52.0)
Hemoglobin: 13.9 g/dL (ref 13.0–17.0)
Potassium: 3.5 mmol/L (ref 3.5–5.1)
SODIUM: 141 mmol/L (ref 135–145)
TCO2: 28 mmol/L (ref 22–32)

## 2017-01-16 LAB — I-STAT TROPONIN, ED: TROPONIN I, POC: 0 ng/mL (ref 0.00–0.08)

## 2017-01-16 LAB — D-DIMER, QUANTITATIVE: D-Dimer, Quant: <0.27 ug/mL-FEU (ref 0.00–0.50)

## 2017-01-16 MED ORDER — PREDNISONE 20 MG PO TABS
60.0000 mg | ORAL_TABLET | Freq: Once | ORAL | Status: AC
  Administered 2017-01-16: 60 mg via ORAL
  Filled 2017-01-16: qty 3

## 2017-01-16 MED ORDER — PREDNISONE 20 MG PO TABS: 60.0000 mg | ORAL_TABLET | Freq: Every day | ORAL | 0 refills | Status: DC

## 2017-01-16 MED ORDER — MONTELUKAST SODIUM 10 MG PO TABS: 10.0000 mg | ORAL_TABLET | Freq: Every day | ORAL | 1 refills | Status: DC

## 2017-01-16 MED ORDER — IPRATROPIUM-ALBUTEROL 0.5-2.5 (3) MG/3ML IN SOLN
3.0000 mL | Freq: Once | RESPIRATORY_TRACT | Status: AC
  Administered 2017-01-16: 3 mL via RESPIRATORY_TRACT
  Filled 2017-01-16: qty 3

## 2017-01-16 MED ORDER — ALBUTEROL SULFATE HFA 108 (90 BASE) MCG/ACT IN AERS
2.0000 | INHALATION_SPRAY | Freq: Once | RESPIRATORY_TRACT | Status: AC
  Administered 2017-01-16: 2 via RESPIRATORY_TRACT
  Filled 2017-01-16: qty 6.7

## 2017-01-16 MED ORDER — BUDESONIDE-FORMOTEROL FUMARATE 80-4.5 MCG/ACT IN AERO: 2.0000 | INHALATION_SPRAY | Freq: Two times a day (BID) | RESPIRATORY_TRACT | 1 refills | Status: DC

## 2017-01-16 MED ORDER — FLUTICASONE PROPIONATE 50 MCG/ACT NA SUSP: 1.0000 | Freq: Every day | NASAL | 1 refills | Status: DC
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
## 2017-01-16 NOTE — Discharge Instructions (Addendum)
To find a primary care or specialty doctor please call [phone] or [phone] to access "[facility] Find a Doctor Service." ° °You may also go on the [facility] website at [email or web address] ° °There are also multiple [facility], [facility], [facility] and [facility] practices throughout the [place] that are frequently accepting new patients. You may find a clinic that is close to your home and contact them. ° °[facility] -  °[address] °[place] [address] °[phone] ° ° °[facility] -  °[address] °[place] [address] °[phone] ° ° °[facility] - °[address]  °[place] [address] °[phone] ° ° °
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
## 2017-02-03 ENCOUNTER — Ambulatory Visit: Payer: Self-pay | Admitting: Internal Medicine

## 2018-04-01 ENCOUNTER — Emergency Department (HOSPITAL_COMMUNITY)
Admission: EM | Admit: 2018-04-01 | Discharge: 2018-04-01 | Disposition: A | Discharge status: Home / Self Care | Payer: Self-pay | Attending: Emergency Medicine | Admitting: Emergency Medicine

## 2018-04-01 ENCOUNTER — Encounter (HOSPITAL_COMMUNITY): Payer: Self-pay

## 2018-04-01 DIAGNOSIS — Z79899 Other long term (current) drug therapy: Secondary | ICD-10-CM | POA: Insufficient documentation

## 2018-04-01 DIAGNOSIS — J45909 Unspecified asthma, uncomplicated: Secondary | ICD-10-CM | POA: Insufficient documentation

## 2018-04-01 DIAGNOSIS — F909 Attention-deficit hyperactivity disorder, unspecified type: Secondary | ICD-10-CM | POA: Insufficient documentation

## 2018-04-01 MED ORDER — ALBUTEROL SULFATE HFA 108 (90 BASE) MCG/ACT IN AERS
2.0000 | INHALATION_SPRAY | RESPIRATORY_TRACT | Status: DC
  Administered 2018-04-01 (×2): 2 via RESPIRATORY_TRACT
  Filled 2018-04-01 (×2): qty 6.7

## 2018-04-01 MED ORDER — IPRATROPIUM-ALBUTEROL 0.5-2.5 (3) MG/3ML IN SOLN
3.0000 mL | Freq: Once | RESPIRATORY_TRACT | Status: AC
  Administered 2018-04-01: 3 mL via RESPIRATORY_TRACT
  Filled 2018-04-01: qty 3

## 2018-04-01 MED ORDER — PREDNISONE 10 MG PO TABS: ORAL_TABLET | ORAL | 0 refills | Status: DC

## 2018-04-01 MED ORDER — ALBUTEROL SULFATE (2.5 MG/3ML) 0.083% IN NEBU: 2.5000 mg | INHALATION_SOLUTION | Freq: Four times a day (QID) | RESPIRATORY_TRACT | 12 refills | Status: DC | PRN

## 2018-04-01 MED ORDER — PREDNISONE 20 MG PO TABS
60.0000 mg | ORAL_TABLET | Freq: Once | ORAL | Status: AC
  Administered 2018-04-01: 60 mg via ORAL
  Filled 2018-04-01: qty 3

## 2018-04-01 MED ORDER — ALBUTEROL SULFATE HFA 108 (90 BASE) MCG/ACT IN AERS: 1.0000 | INHALATION_SPRAY | Freq: Four times a day (QID) | RESPIRATORY_TRACT | 0 refills | Status: DC | PRN

## 2018-04-01 NOTE — ED Triage Notes (Signed)
Pt. Come with reports of wheezing, shob, and CP when pt. Coughs. Pt. Denies fever. Pt. NAD a/o x4

## 2018-04-01 NOTE — Discharge Instructions (Signed)
Return if any problems.

## 2018-04-01 NOTE — ED Provider Notes (Signed)
[facility] EMERGENCY DEPARTMENT Provider Note   CSN: [id number] Arrival date & time: 04/01/18  1740     History   Chief Complaint No chief complaint on file.   HPI Eric Mills is a 22 y.o. male.  The history is provided by the patient. No language interpreter was used.  Shortness of Breath  This is a new problem. The average episode lasts 1 day. The problem occurs continuously.The problem has been gradually worsening. Associated symptoms include cough. Pertinent negatives include no fever. He has tried nothing for the symptoms. He has had no prior hospitalizations. Associated medical issues include asthma.  Pt complains of asthma attack.  Pt reports he is out of albuterol   Past Medical History:  Diagnosis Date  . Asthma   . Attention deficit disorder (ADD)   . Seasonal allergies     There are no active problems to display for this patient.   Past Surgical History:  Procedure Laterality Date  . ANKLE SURGERY          Home Medications    Prior to Admission medications   Medication Sig Start Date End Date Taking? Authorizing Provider  albuterol (PROVENTIL HFA;VENTOLIN HFA) 108 (90 Base) MCG/ACT inhaler Inhale 1-2 puffs into the lungs every 6 (six) hours as needed for wheezing or shortness of breath. 04/01/18   Elson Areas, PA-C  albuterol (PROVENTIL) (2.5 MG/3ML) 0.083% nebulizer solution Take 3 mLs (2.5 mg total) by nebulization every 6 (six) hours as needed for wheezing or shortness of breath. 04/01/18   Elson Areas, PA-C  budesonide-formoterol (SYMBICORT) 80-4.5 MCG/ACT inhaler Inhale 2 puffs into the lungs 2 (two) times daily. 11/02/15   Mesner, Barbara Cower, MD  budesonide-formoterol (SYMBICORT) 80-4.5 MCG/ACT inhaler Inhale 2 puffs into the lungs 2 (two) times daily. 01/16/17   Ward, Layla Maw, DO  fluticasone (FLONASE) 50 MCG/ACT nasal spray Place 2 sprays into both nostrils daily as needed (for seasonal allergies).    [provider]   fluticasone (FLONASE) 50 MCG/ACT nasal spray Place 1 spray into both nostrils daily. 01/16/17   Ward, Layla Maw, DO  montelukast (SINGULAIR) 10 MG tablet Take 10 mg by mouth at bedtime as needed (for seasonal allergies).    [provider]  montelukast (SINGULAIR) 10 MG tablet Take 1 tablet (10 mg total) by mouth at bedtime. 01/16/17   Ward, Layla Maw, DO  predniSONE (DELTASONE) 10 MG tablet 5,4,3,2,1 taper 04/01/18   Elson Areas, PA-C    Family History History reviewed. No pertinent family history.  Social History Social History   Tobacco Use  . Smoking status: Never Smoker  . Smokeless tobacco: Never Used  Substance Use Topics  . Alcohol use: No  . Drug use: No     Allergies   Fish allergy; Milk-related compounds; and Shellfish allergy   Review of Systems Review of Systems  Constitutional: Negative for fever.  Respiratory: Positive for cough and shortness of breath.   All other systems reviewed and are negative.    Physical Exam Updated Vital Signs BP 130/73 (BP Location: Right Arm)   Pulse 62   Temp 98 F (36.7 C) (Oral)   Resp 16   Ht 5\' 9"  (1.753 m)   SpO2 100%   BMI 27.32 kg/m   Physical Exam  Constitutional: He appears well-developed and well-nourished.  HENT:  Head: Normocephalic and atraumatic.  Eyes: Conjunctivae are normal.  Neck: Neck supple.  Cardiovascular: Normal rate and regular rhythm.  No murmur heard.  Pulmonary/Chest: Effort normal. No respiratory distress. He has wheezes.  Abdominal: Soft. There is no tenderness.  Musculoskeletal: He exhibits no edema.  Neurological: He is alert.  Skin: Skin is warm and dry.  Psychiatric: He has a normal mood and affect.  Nursing note and vitals reviewed.    ED Treatments / Results  Labs (all labs ordered are listed, but only abnormal results are displayed) Labs Reviewed - No data to display  EKG None  Radiology No results found.  Procedures Procedures (including critical care  time)  Medications Ordered in ED Medications  albuterol (PROVENTIL HFA;VENTOLIN HFA) 108 (90 Base) MCG/ACT inhaler 2 puff (2 puffs Inhalation Given 04/01/18 2038)  ipratropium-albuterol (DUONEB) 0.5-2.5 (3) MG/3ML nebulizer solution 3 mL (3 mLs Nebulization Given 04/01/18 1832)  predniSONE (DELTASONE) tablet 60 mg (60 mg Oral Given 04/01/18 1831)     Initial Impression / Assessment and Plan / ED Course  I have reviewed the triage vital signs and the nursing notes.  Pertinent labs & imaging results that were available during my care of the patient were reviewed by me and considered in my medical decision making (see chart for details).     MDM  Pt given duoneb and prednisone.  Pt advised to follow up with primary care.  Pt given albuterol inhaler here and rx for albuterol and prednisone   Final Clinical Impressions(s) / ED Diagnoses   Final diagnoses:  Moderate asthma without complication, unspecified whether persistent    ED Discharge Orders         Ordered    predniSONE (DELTASONE) 10 MG tablet     04/01/18 2031    albuterol (PROVENTIL HFA;VENTOLIN HFA) 108 (90 Base) MCG/ACT inhaler  Every 6 hours PRN     04/01/18 2031    albuterol (PROVENTIL) (2.5 MG/3ML) 0.083% nebulizer solution  Every 6 hours PRN     04/01/18 2031        An After Visit Summary was printed and given to the patient.    Elson AreasSofia, Jamey Demchak K, PA-C 04/01/18 2248    Cathren LaineSteinl, Kevin, MD 04/02/18 [phone]

## 2018-04-03 MED FILL — PROVENTIL HFA 108 (90 BASE): 108 (90 BAS | 25 days supply | Qty: 7 | Fill #0

## 2018-04-03 MED FILL — predniSONE 10 MG TABS: 10 | 5 days supply | Qty: 15 | Fill #0

## 2018-05-07 ENCOUNTER — Emergency Department (HOSPITAL_COMMUNITY): Payer: 59

## 2018-05-07 ENCOUNTER — Other Ambulatory Visit: Payer: Self-pay

## 2018-05-07 ENCOUNTER — Emergency Department (HOSPITAL_COMMUNITY)
Admission: EM | Admit: 2018-05-07 | Discharge: 2018-05-07 | Disposition: A | Discharge status: Home / Self Care | Payer: 59 | Attending: Emergency Medicine | Admitting: Emergency Medicine

## 2018-05-07 ENCOUNTER — Encounter (HOSPITAL_COMMUNITY): Payer: Self-pay

## 2018-05-07 DIAGNOSIS — J4541 Moderate persistent asthma with (acute) exacerbation: Secondary | ICD-10-CM

## 2018-05-07 DIAGNOSIS — B9789 Other viral agents as the cause of diseases classified elsewhere: Secondary | ICD-10-CM

## 2018-05-07 DIAGNOSIS — Z79899 Other long term (current) drug therapy: Secondary | ICD-10-CM | POA: Insufficient documentation

## 2018-05-07 DIAGNOSIS — R062 Wheezing: Secondary | ICD-10-CM | POA: Insufficient documentation

## 2018-05-07 DIAGNOSIS — J069 Acute upper respiratory infection, unspecified: Secondary | ICD-10-CM

## 2018-05-07 DIAGNOSIS — R05 Cough: Secondary | ICD-10-CM | POA: Diagnosis not present

## 2018-05-07 MED ORDER — ALBUTEROL SULFATE (2.5 MG/3ML) 0.083% IN NEBU
5.0000 mg | INHALATION_SOLUTION | Freq: Once | RESPIRATORY_TRACT | Status: AC
  Administered 2018-05-07: 5 mg via RESPIRATORY_TRACT
  Filled 2018-05-07: qty 6

## 2018-05-07 MED ORDER — PREDNISONE 20 MG PO TABS: 60.0000 mg | ORAL_TABLET | Freq: Every day | ORAL | 0 refills | Status: DC

## 2018-05-07 MED ORDER — IPRATROPIUM BROMIDE 0.02 % IN SOLN
0.5000 mg | Freq: Once | RESPIRATORY_TRACT | Status: AC
  Administered 2018-05-07: 0.5 mg via RESPIRATORY_TRACT
  Filled 2018-05-07: qty 2.5

## 2018-05-07 MED ORDER — ALBUTEROL SULFATE HFA 108 (90 BASE) MCG/ACT IN AERS: 2.0000 | INHALATION_SPRAY | RESPIRATORY_TRACT | 1 refills | Status: DC | PRN

## 2018-05-07 MED ORDER — PREDNISONE 20 MG PO TABS
60.0000 mg | ORAL_TABLET | Freq: Once | ORAL | Status: AC
  Administered 2018-05-07: 60 mg via ORAL
  Filled 2018-05-07: qty 3

## 2018-05-07 NOTE — ED Notes (Signed)
Patient transported to X-ray 

## 2018-05-07 NOTE — ED Provider Notes (Signed)
[facility]-EMERGENCY DEPT Provider Note   CSN: [id number] Arrival date & time: 05/07/18  2058     History   Chief Complaint Chief Complaint  Patient presents with  . Influenza  . Chest Pain    HPI Eric Mills is a 23 y.o. male.  Patient with hx asthma, c/o increased wheezing/sob, in the past few days. Also non prod cough, and with coughing spells, right chest pain/discomfort. Patient symptoms moderate, persistent, felt worse today. No leg pain or swelling. +nasal congestion, scratchy throat. No trouble swallowing. Non smoker. Denies recent oral steroid use.   The history is provided by the patient.  Influenza  Presenting symptoms: cough and shortness of breath   Presenting symptoms: no fever, no headaches, no sore throat and no vomiting   Associated symptoms: nasal congestion   Associated symptoms: no neck stiffness   Chest Pain   Associated symptoms include cough and shortness of breath. Pertinent negatives include no abdominal pain, no fever, no headaches and no vomiting.    Past Medical History:  Diagnosis Date  . Asthma   . Attention deficit disorder (ADD)   . Seasonal allergies     There are no active problems to display for this patient.   Past Surgical History:  Procedure Laterality Date  . ANKLE SURGERY          Home Medications    Prior to Admission medications   Medication Sig Start Date End Date Taking? Authorizing Provider  albuterol (PROVENTIL HFA;VENTOLIN HFA) 108 (90 Base) MCG/ACT inhaler Inhale 1-2 puffs into the lungs every 6 (six) hours as needed for wheezing or shortness of breath. 04/01/18   Elson AreasSofia, Leslie K, PA-C  albuterol (PROVENTIL) (2.5 MG/3ML) 0.083% nebulizer solution Take 3 mLs (2.5 mg total) by nebulization every 6 (six) hours as needed for wheezing or shortness of breath. 04/01/18   Elson AreasSofia, Leslie K, PA-C  budesonide-formoterol (SYMBICORT) 80-4.5 MCG/ACT inhaler Inhale 2 puffs into the lungs 2 (two) times daily.  11/02/15   Mesner, Barbara CowerJason, MD  budesonide-formoterol (SYMBICORT) 80-4.5 MCG/ACT inhaler Inhale 2 puffs into the lungs 2 (two) times daily. 01/16/17   Ward, Layla MawKristen N, DO  fluticasone (FLONASE) 50 MCG/ACT nasal spray Place 2 sprays into both nostrils daily as needed (for seasonal allergies).    [provider]  fluticasone (FLONASE) 50 MCG/ACT nasal spray Place 1 spray into both nostrils daily. 01/16/17   Ward, Layla MawKristen N, DO  montelukast (SINGULAIR) 10 MG tablet Take 10 mg by mouth at bedtime as needed (for seasonal allergies).    [provider]  montelukast (SINGULAIR) 10 MG tablet Take 1 tablet (10 mg total) by mouth at bedtime. 01/16/17   Ward, Layla MawKristen N, DO  predniSONE (DELTASONE) 10 MG tablet 5,4,3,2,1 taper 04/01/18   Elson AreasSofia, Leslie K, PA-C    Family History No family history on file.  Social History Social History   Tobacco Use  . Smoking status: Never Smoker  . Smokeless tobacco: Never Used  Substance Use Topics  . Alcohol use: No  . Drug use: No     Allergies   Fish allergy; Milk-related compounds; and Shellfish allergy   Review of Systems Review of Systems  Constitutional: Negative for fever.  HENT: Positive for congestion. Negative for sore throat.   Eyes: Negative for redness.  Respiratory: Positive for cough, shortness of breath and wheezing.   Cardiovascular: Positive for chest pain.  Gastrointestinal: Negative for abdominal pain and vomiting.  Genitourinary: Negative for flank pain.  Musculoskeletal: Negative for  neck pain and neck stiffness.  Skin: Negative for rash.  Neurological: Negative for headaches.  Hematological: Does not bruise/bleed easily.  Psychiatric/Behavioral: Negative for confusion.     Physical Exam Updated Vital Signs BP (!) 147/79 (BP Location: Right Arm)   Pulse 86   Temp 98.4 F (36.9 C) (Oral)   Resp 11   Ht 1.765 m (5' 9.5")   Wt 93.9 kg   SpO2 95%   BMI 30.13 kg/m   Physical Exam Vitals signs and nursing  note reviewed.  Constitutional:      Appearance: Normal appearance. He is well-developed.  HENT:     Head: Atraumatic.     Nose: Congestion and rhinorrhea present.     Mouth/Throat:     Mouth: Mucous membranes are moist.     Pharynx: Oropharynx is clear.  Eyes:     General: No scleral icterus.    Conjunctiva/sclera: Conjunctivae normal.     Pupils: Pupils are equal, round, and reactive to light.  Neck:     Musculoskeletal: Normal range of motion and neck supple. No neck rigidity.     Trachea: No tracheal deviation.  Cardiovascular:     Rate and Rhythm: Normal rate and regular rhythm.     Pulses: Normal pulses.     Heart sounds: Normal heart sounds. No murmur. No friction rub. No gallop.   Pulmonary:     Effort: Pulmonary effort is normal. No accessory muscle usage or respiratory distress.     Breath sounds: Wheezing present.  Chest:     Chest wall: Tenderness present.  Abdominal:     General: Bowel sounds are normal. There is no distension.     Palpations: Abdomen is soft.     Tenderness: There is no abdominal tenderness. There is no guarding.  Genitourinary:    Comments: No cva tenderness. Musculoskeletal:        General: No swelling or tenderness.     Right lower leg: No edema.     Left lower leg: No edema.  Skin:    General: Skin is warm and dry.     Findings: No rash.  Neurological:     Mental Status: He is alert.     Comments: Alert, speech clear.   Psychiatric:        Mood and Affect: Mood normal.      ED Treatments / Results  Labs (all labs ordered are listed, but only abnormal results are displayed) Labs Reviewed - No data to display  EKG None  Radiology Dg Chest 2 View  Result Date: 05/07/2018 CLINICAL DATA:  Dyspnea and chest pain EXAM: CHEST - 2 VIEW COMPARISON:  01/16/2017 chest radiograph. FINDINGS: Stable cardiomediastinal silhouette with normal heart size. No pneumothorax. No pleural effusion. Lungs appear clear, with no acute consolidative  airspace disease and no pulmonary edema. IMPRESSION: No active cardiopulmonary disease. Electronically Signed   By: Delbert Phenix M.D.   On: 05/07/2018 21:31    Procedures Procedures (including critical care time)  Medications Ordered in ED Medications  albuterol (PROVENTIL) (2.5 MG/3ML) 0.083% nebulizer solution 5 mg (has no administration in time range)  ipratropium (ATROVENT) nebulizer solution 0.5 mg (has no administration in time range)  predniSONE (DELTASONE) tablet 60 mg (has no administration in time range)     Initial Impression / Assessment and Plan / ED Course  I have reviewed the triage vital signs and the nursing notes.  Pertinent labs & imaging results that were available during my care of the patient  were reviewed by me and considered in my medical decision making (see chart for details).  Albuterol and atrovent neb.   Prednisone po.  Cxr.  Reviewed nursing notes and prior charts for additional history.   Additional neb tx given.  cxr reviewed - no pna.   Recheck breathing comfortably, wheezing markedly improved.  Pt currently appears stable for d/c.   Final Clinical Impressions(s) / ED Diagnoses   Final diagnoses:  None    ED Discharge Orders    None       Cathren LaineSteinl, Arish Redner, MD 05/08/18 2116

## 2018-05-07 NOTE — Discharge Instructions (Addendum)
It was our pleasure to provide your ER care today - we hope that you feel better. ° °Take prednisone as prescribed. Use albuterol inhaler as need.  ° °Follow up with primary care doctor in the next few days if symptoms fail to improve/resolve. ° °Return to ER if worse, new symptoms, increased trouble breathing, other concern.  °

## 2018-05-07 NOTE — ED Notes (Signed)
Pt and family verbalized discharge instructions and follow up care. Pt ambulatory and leaving with family

## 2018-05-07 NOTE — ED Notes (Signed)
Bed: BJ47 Expected date:  Expected time:  Means of arrival:  Comments: 23 yr old asthma, flu like sx

## 2018-05-07 NOTE — ED Triage Notes (Addendum)
Per ems: pt coming from work c/o flu like symptoms that started 3 days ago and chest wall pain from coughing that started today. Hx of asthma. Wheezing in all fields noted originally. Fever 101.7  5 mg Albuterol treatment given by ems 324 mg aspirin
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
## 2018-08-17 IMAGING — CR DG CHEST 2V
2 series · 2 of 2 positions shown · non-contrast
Comparison: Radiographs December 31, 2016.

CLINICAL DATA: Productive cough, shortness of breath.

EXAM:
CHEST  2 VIEW

[w chest pa]
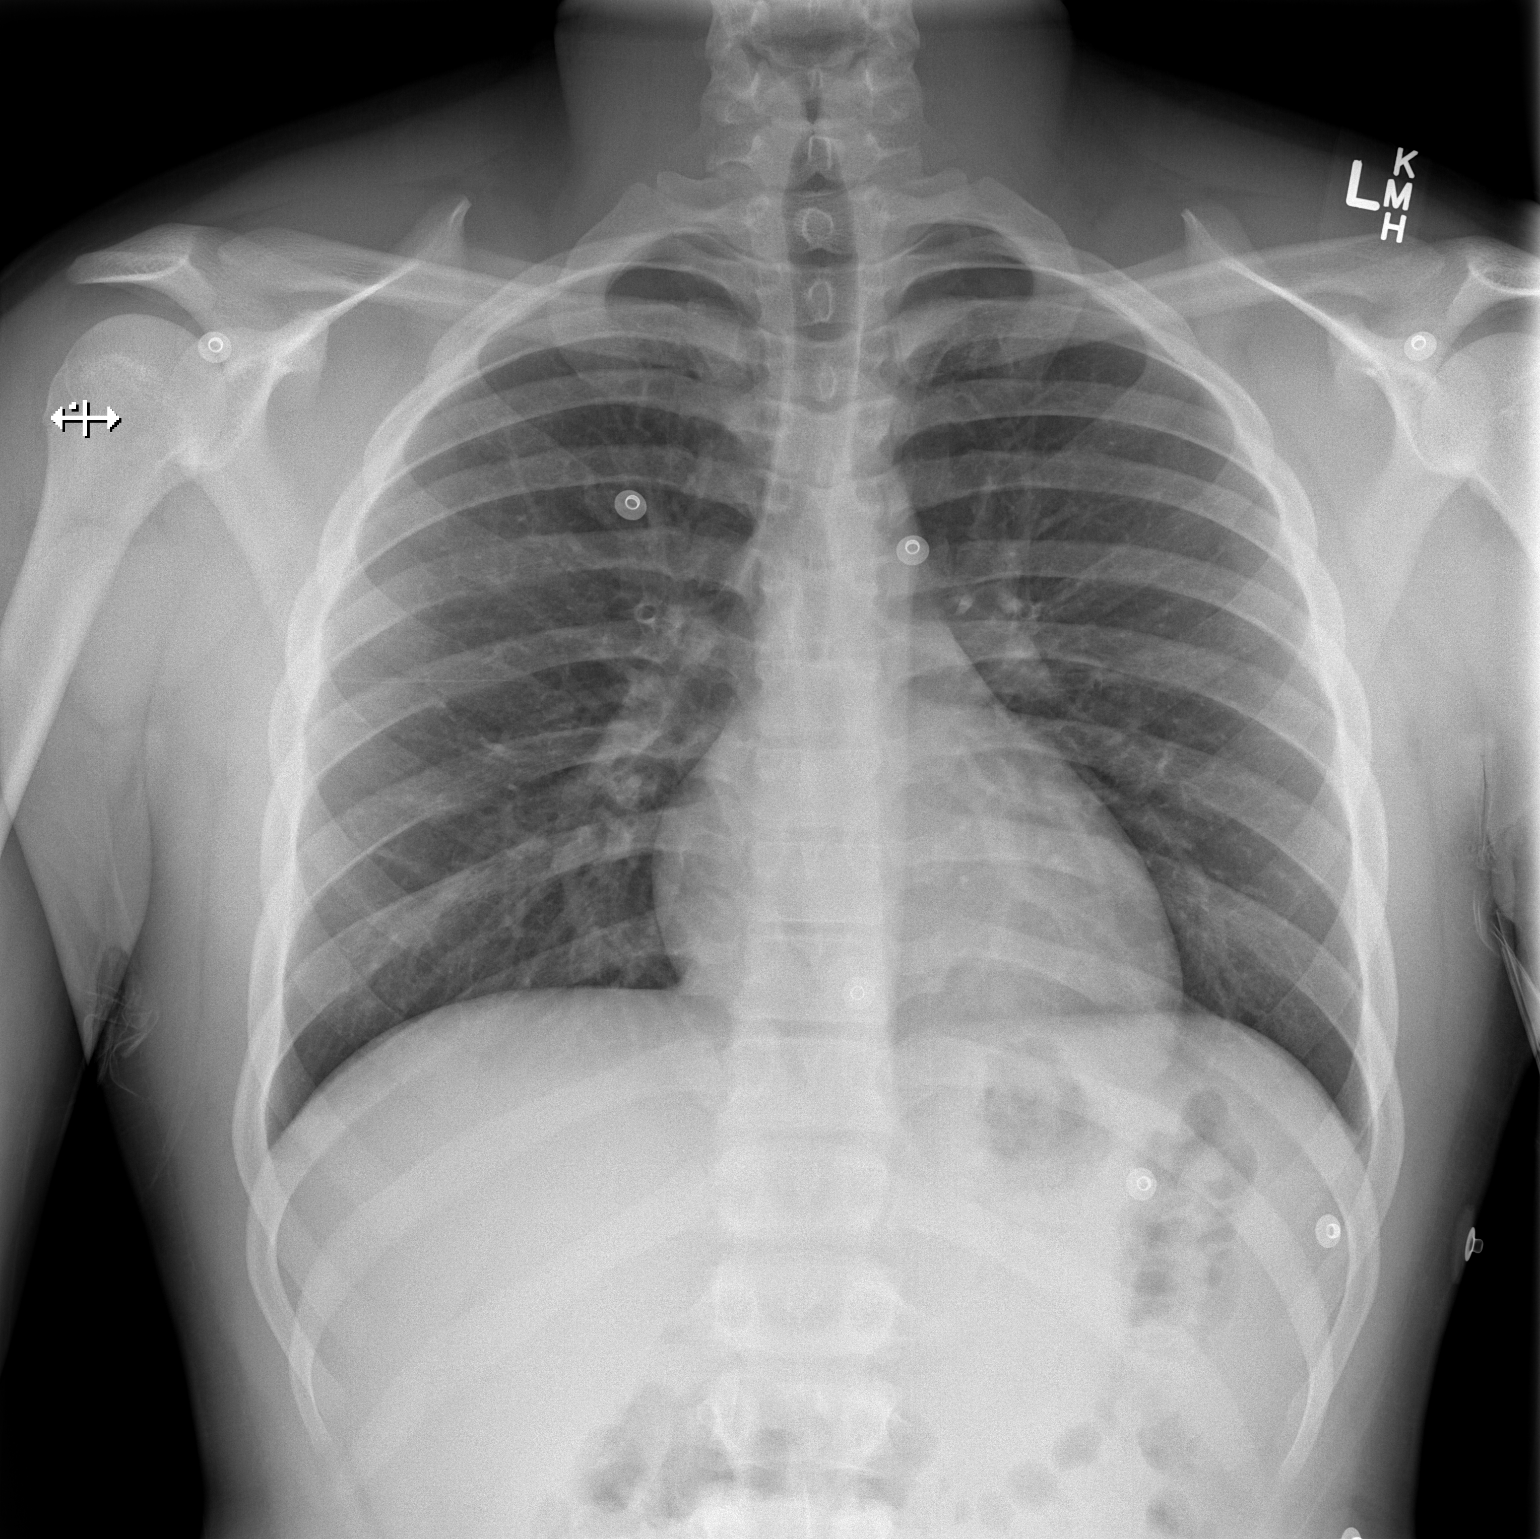

[w chest lat]
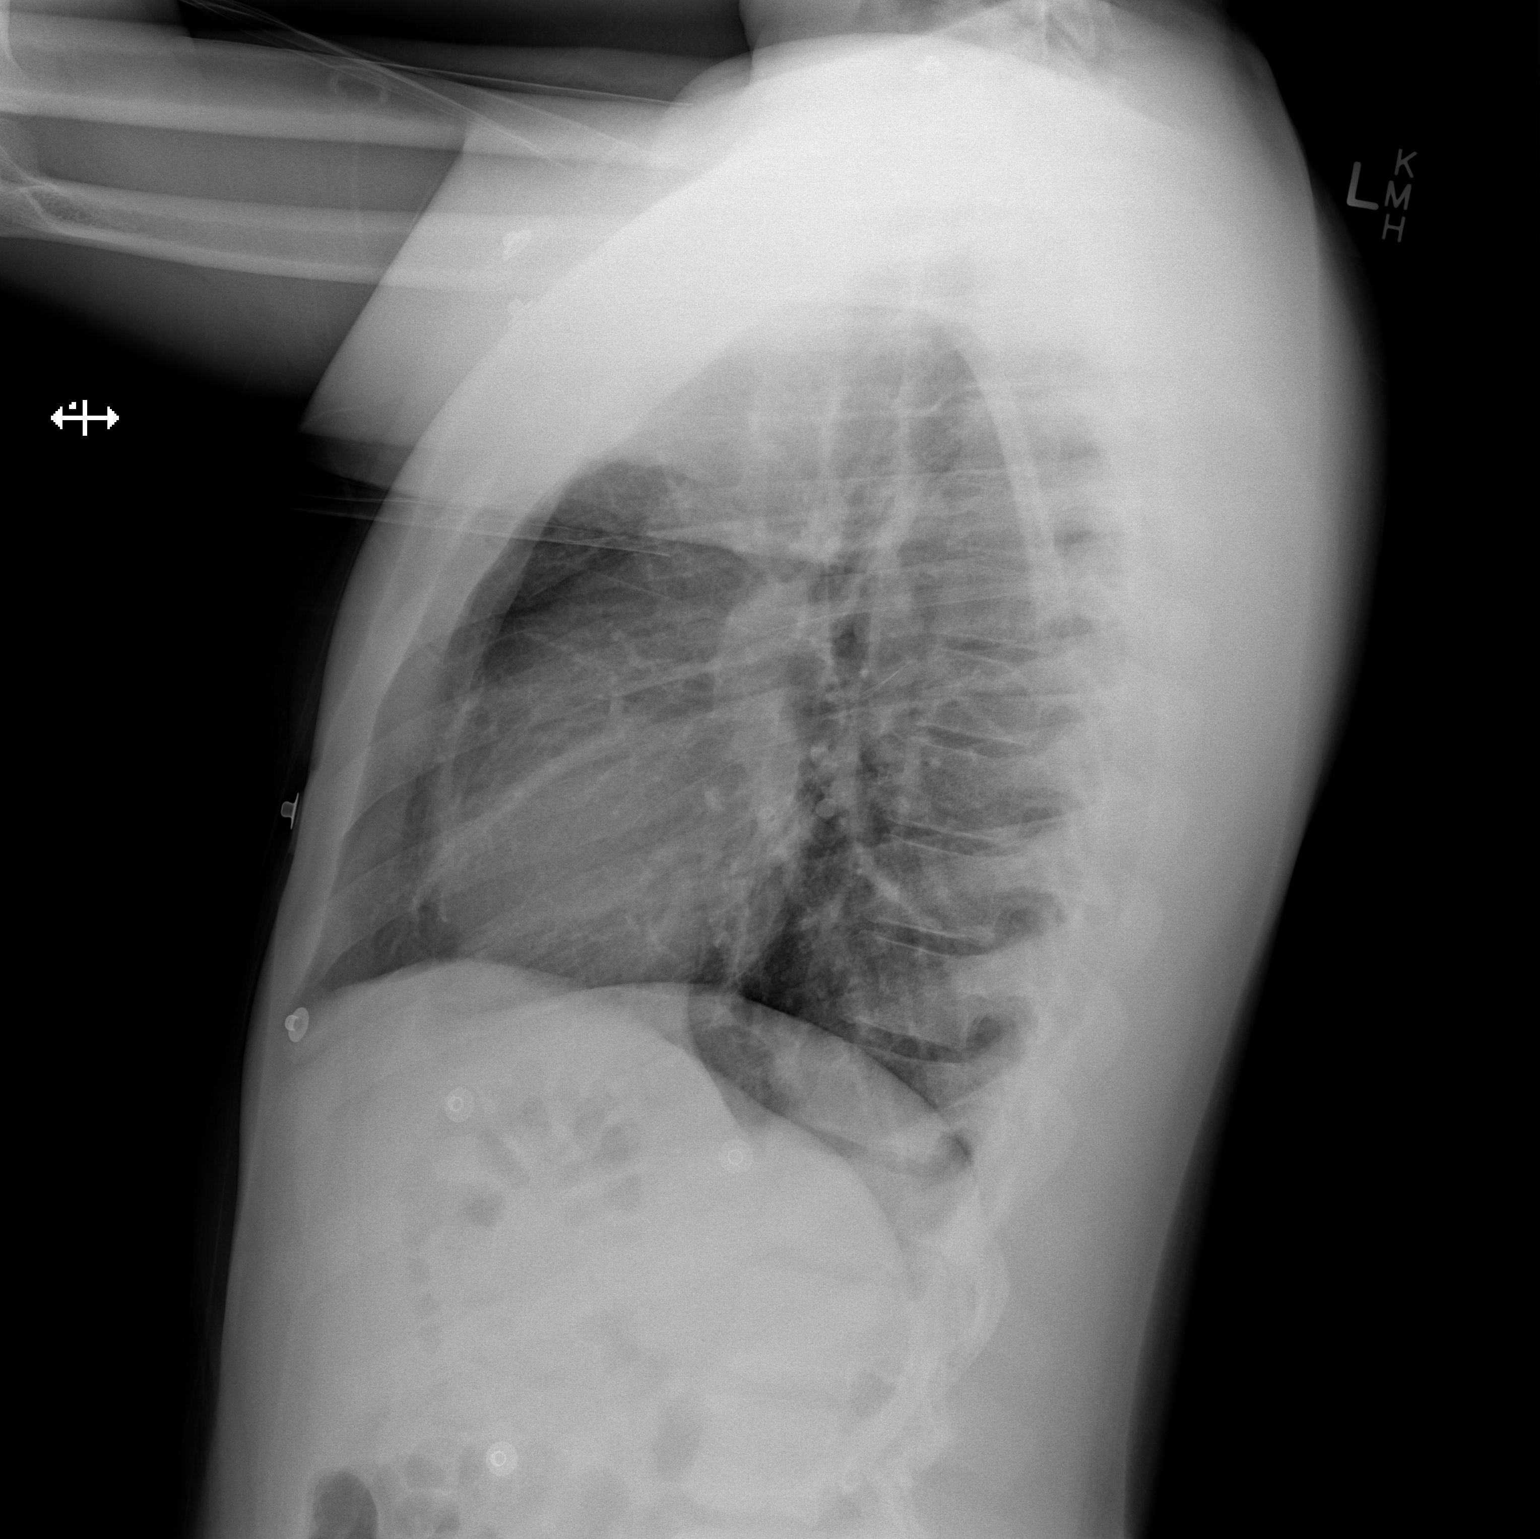

[2 of 2 positions shown; findings below may reference images not displayed]

FINDINGS: The heart size and mediastinal contours are within normal limits.
Both lungs are clear. No pneumothorax or pleural effusion is noted.
The visualized skeletal structures are unremarkable.
IMPRESSION: No active cardiopulmonary disease.

## 2018-08-31 IMAGING — CR DG CHEST 2V
2 series · 2 of 2 positions shown · non-contrast
Comparison: Radiograph 01/02/2017, additional priors

CLINICAL DATA: Shortness of breath.  Productive cough for 2 weeks.

EXAM:
CHEST  2 VIEW

[chest pa]
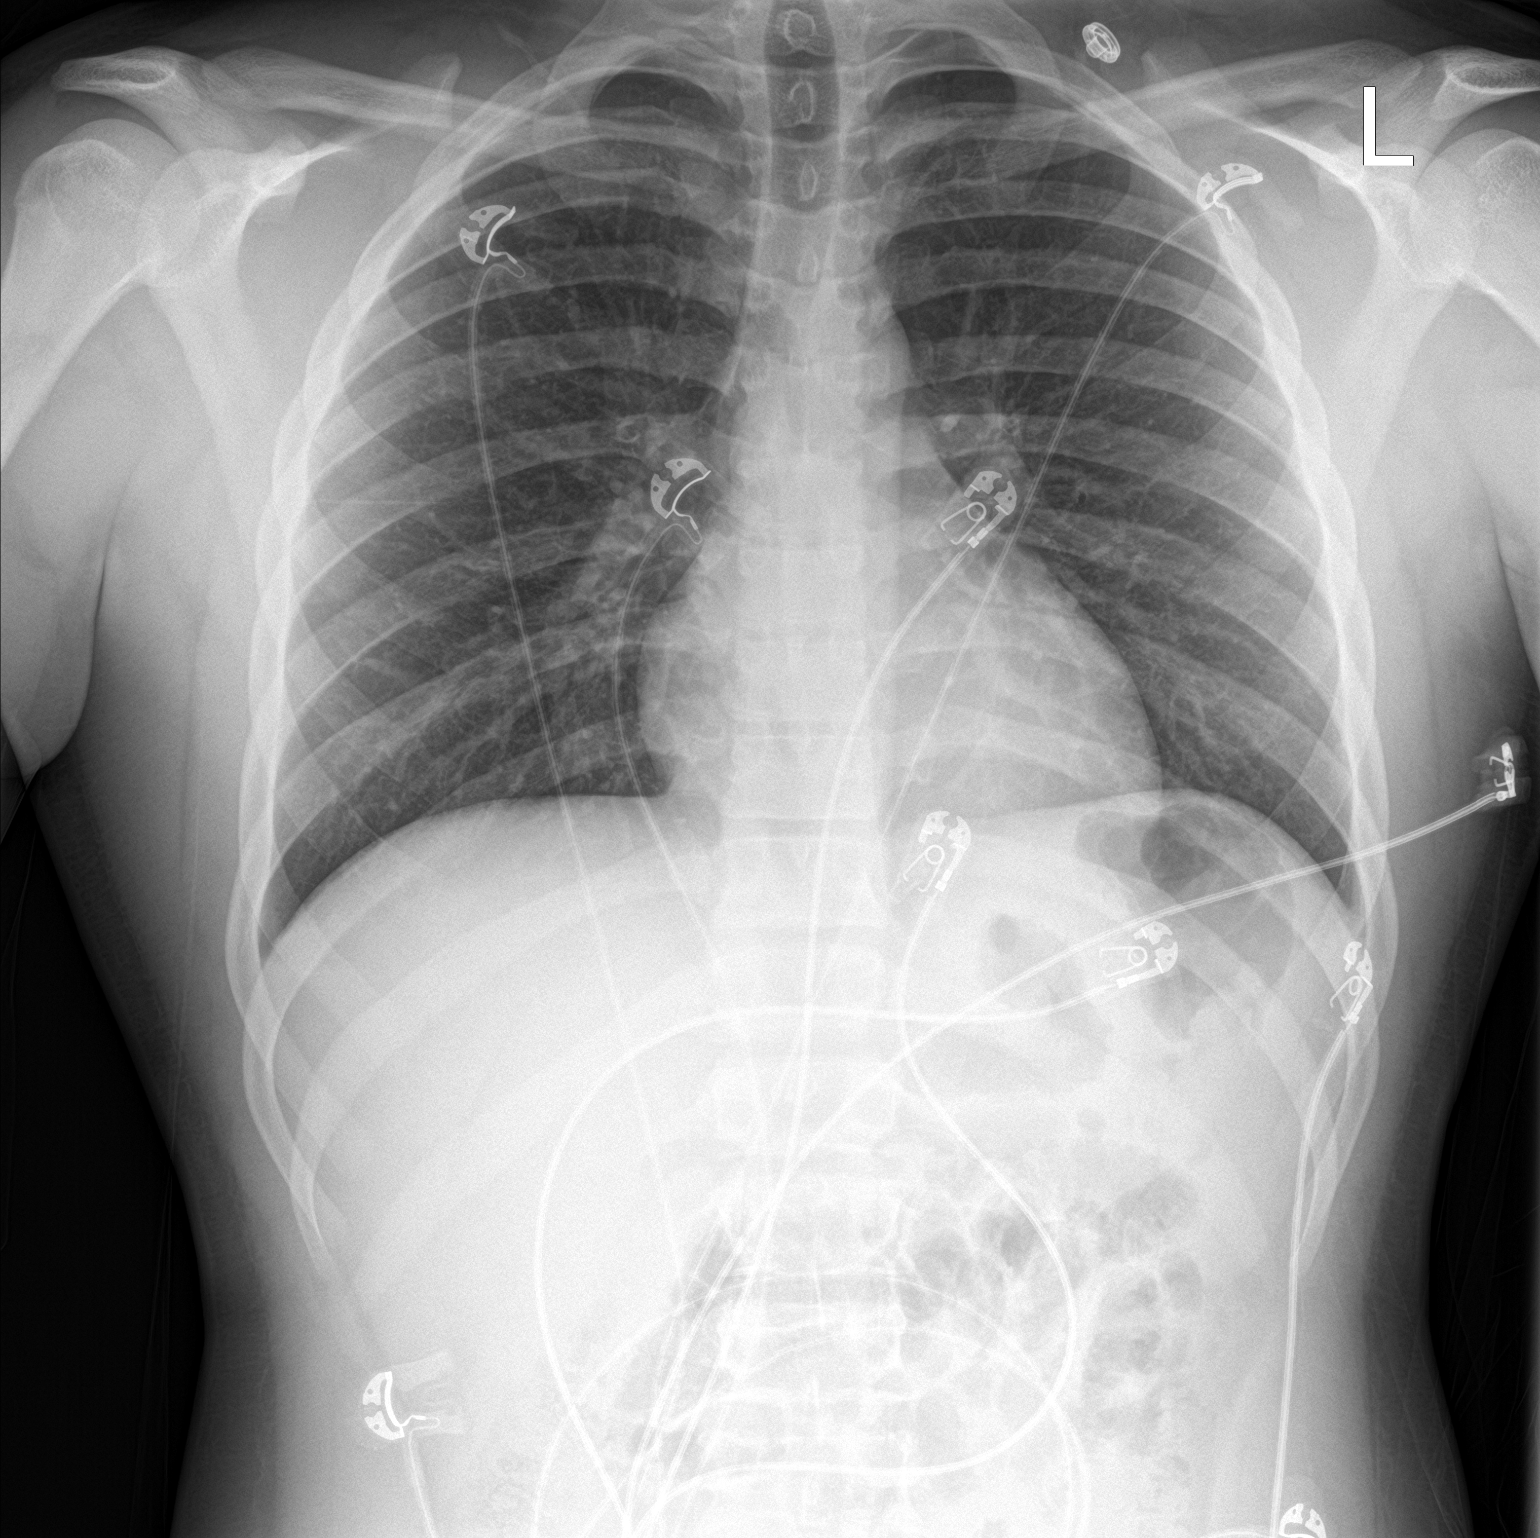

[chest lat]
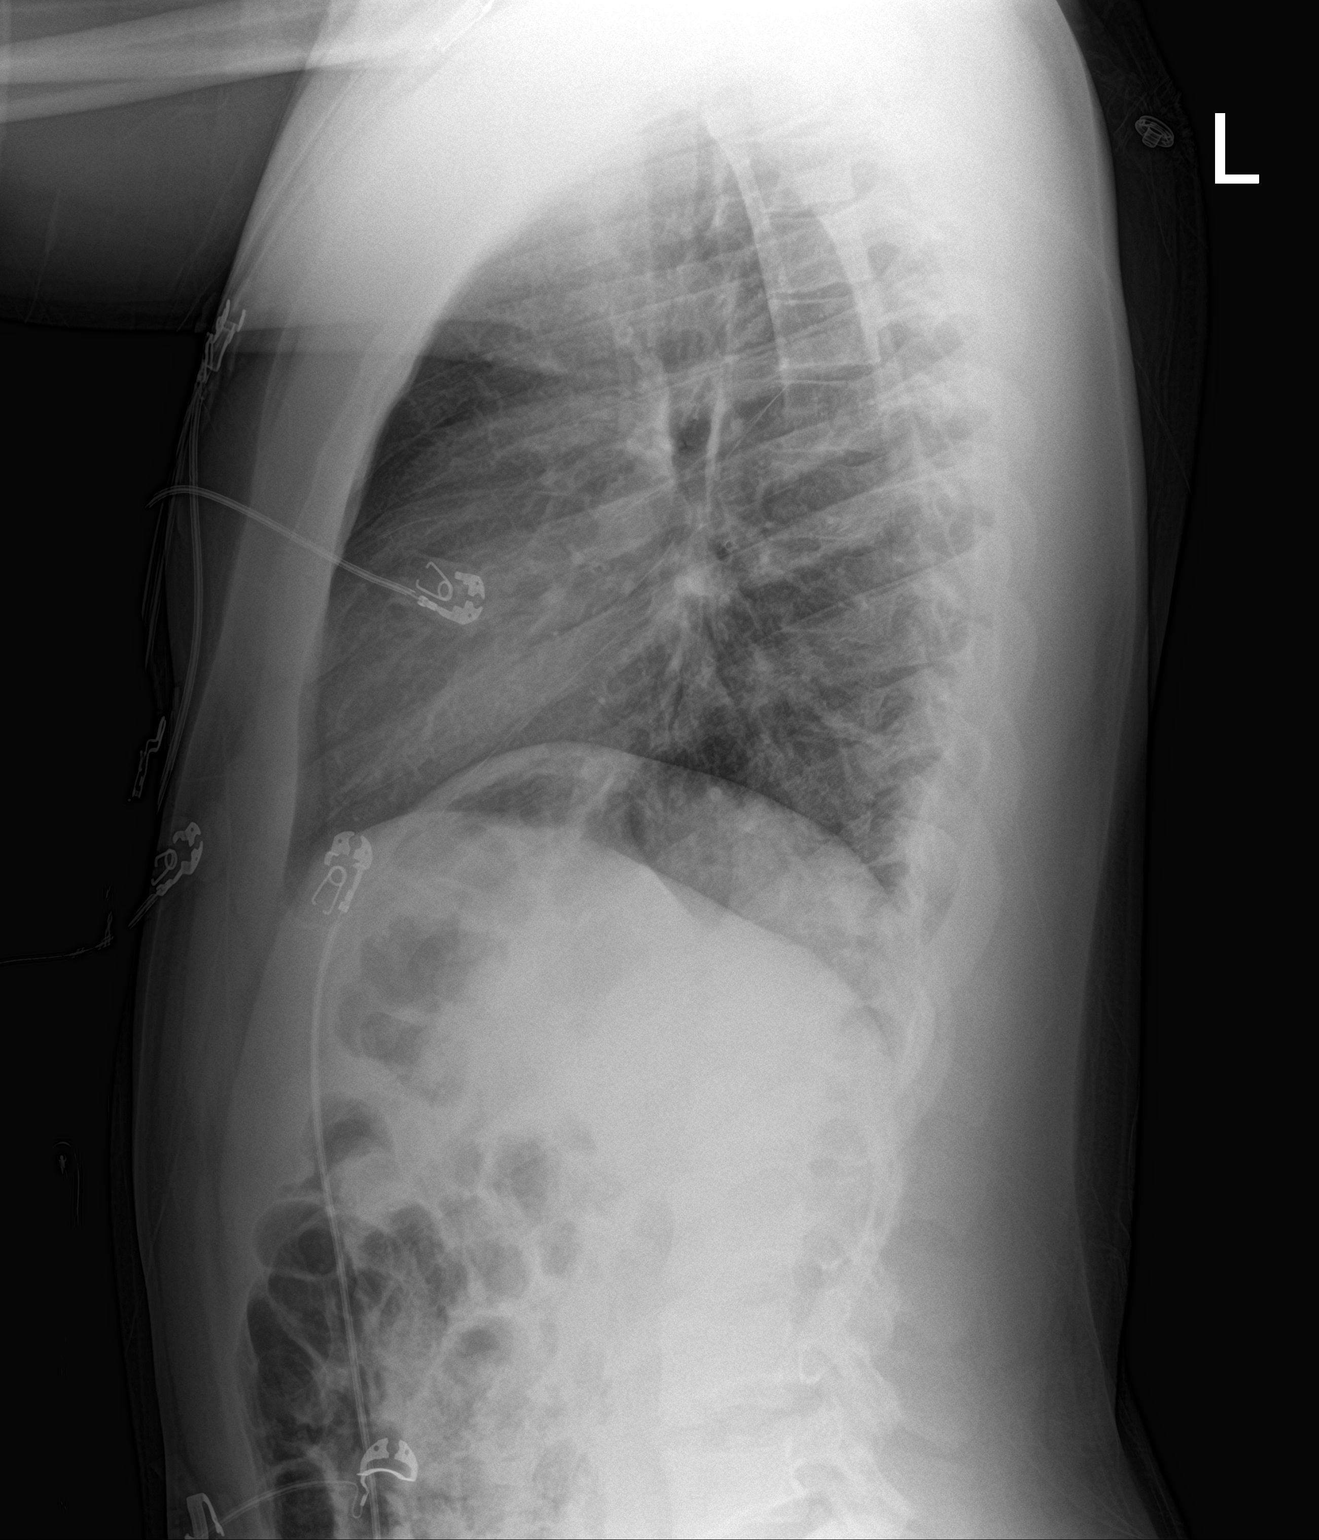

[2 of 2 positions shown; findings below may reference images not displayed]

FINDINGS: The cardiomediastinal contours are normal. The lungs are clear.
Pulmonary vasculature is normal. No consolidation, pleural effusion,
or pneumothorax. No acute osseous abnormalities are seen.
IMPRESSION: No acute pulmonary process.

## 2018-09-17 ENCOUNTER — Encounter: Payer: Self-pay | Admitting: Emergency Medicine

## 2018-09-17 ENCOUNTER — Emergency Department
Admission: EM | Admit: 2018-09-17 | Discharge: 2018-09-17 | Disposition: A | Discharge status: Home / Self Care | Payer: 59 | Attending: Emergency Medicine | Admitting: Emergency Medicine

## 2018-09-17 ENCOUNTER — Other Ambulatory Visit: Payer: Self-pay

## 2018-09-17 DIAGNOSIS — L309 Dermatitis, unspecified: Secondary | ICD-10-CM | POA: Insufficient documentation

## 2018-09-17 DIAGNOSIS — J45909 Unspecified asthma, uncomplicated: Secondary | ICD-10-CM | POA: Insufficient documentation

## 2018-09-17 MED ORDER — PREDNISONE 50 MG PO TABS: ORAL_TABLET | ORAL | 0 refills | Status: DC

## 2018-09-17 MED ORDER — CETAPHIL MOISTURIZING EX LOTN: 1.0000 "application " | TOPICAL_LOTION | CUTANEOUS | 0 refills | Status: DC | PRN

## 2018-09-17 NOTE — Discharge Instructions (Addendum)
Please use Cetaphil lotion to your elbows and knees.  I have given you a prescription for prednisone for inflammation and itching for 5 days.  Please call Dr. Adolphus Birchwood with [facility] for an appointment for continued management.

## 2018-09-17 NOTE — ED Provider Notes (Signed)
[facility] Emergency Department Provider Note  ____________________________________________  Time seen: Approximately 4:16 PM  I have reviewed the triage vital signs and the nursing notes.   HISTORY  Chief Complaint Rash    HPI Eric Mills is a 23 y.o. male that presents to the emergency department for evaluation of worsening eczema.  Patient states that his eczema has been flaring up for about 2 weeks.  Areas that are most affected are his elbows and his knees but his entire arms and legs itch.  He states that the dry skin is now extending into his tattoos.  He has several spots that he has scratched so much that they are bleeding. He is also noticing some dry skin now to his neck.  He does not recall the last time it got this bad.  He has been using hydrocortisone, which is not really helping and Vaseline, which makes him itch.   Past Medical History:  Diagnosis Date  . Asthma   . Attention deficit disorder (ADD)   . Seasonal allergies     There are no active problems to display for this patient.   Past Surgical History:  Procedure Laterality Date  . ANKLE SURGERY      Prior to Admission medications   Medication Sig Start Date End Date Taking? Authorizing Provider  albuterol (PROVENTIL HFA;VENTOLIN HFA) 108 (90 Base) MCG/ACT inhaler Inhale 1-2 puffs into the lungs every 6 (six) hours as needed for wheezing or shortness of breath. 04/01/18   Elson AreasSofia, Leslie K, PA-C  albuterol (PROVENTIL HFA;VENTOLIN HFA) 108 (90 Base) MCG/ACT inhaler Inhale 2 puffs into the lungs every 4 (four) hours as needed for wheezing or shortness of breath. 05/07/18   Cathren LaineSteinl, Kevin, MD  albuterol (PROVENTIL) (2.5 MG/3ML) 0.083% nebulizer solution Take 3 mLs (2.5 mg total) by nebulization every 6 (six) hours as needed for wheezing or shortness of breath. 04/01/18   Elson AreasSofia, Leslie K, PA-C  budesonide-formoterol (SYMBICORT) 80-4.5 MCG/ACT inhaler Inhale 2 puffs into the lungs 2 (two)  times daily. Patient not taking: Reported on 05/07/2018 01/16/17   Ward, Layla MawKristen N, DO  cetaphil (CETAPHIL) lotion Apply 1 application topically as needed for dry skin. 09/17/18   Enid DerryWagner, Franciscojavier Wronski, PA-C  fluticasone (FLONASE) 50 MCG/ACT nasal spray Place 1 spray into both nostrils daily. Patient not taking: Reported on 05/07/2018 01/16/17   Ward, Layla MawKristen N, DO  montelukast (SINGULAIR) 10 MG tablet Take 1 tablet (10 mg total) by mouth at bedtime. Patient not taking: Reported on 05/07/2018 01/16/17   Ward, Layla MawKristen N, DO  predniSONE (DELTASONE) 50 MG tablet Take 1 tablet per day 09/17/18   Enid DerryWagner, Phillip Maffei, PA-C    Allergies Fish allergy; Milk-related compounds; and Shellfish allergy  History reviewed. No pertinent family history.  Social History Social History   Tobacco Use  . Smoking status: Never Smoker  . Smokeless tobacco: Never Used  Substance Use Topics  . Alcohol use: No  . Drug use: No     Review of Systems  Constitutional: No fever/chills Gastrointestinal:  No nausea, no vomiting.  Musculoskeletal: Negative for musculoskeletal pain. Skin: Negative for abrasions, lacerations, ecchymosis.  Positive for rash.   ____________________________________________   PHYSICAL EXAM:  VITAL SIGNS: ED Triage Vitals [09/17/18 1614]  Enc Vitals Group     BP      Pulse      Resp      Temp      Temp src      SpO2  Weight 200 lb (90.7 kg)     Height 5\' 9"  (1.753 m)     Head Circumference      Peak Flow      Pain Score 8     Pain Loc      Pain Edu?      Excl. in GC?      Constitutional: Alert and oriented. Well appearing and in no acute distress. Eyes: Conjunctivae are normal. PERRL. EOMI. Head: Atraumatic. ENT:      Ears:      Nose: No congestion/rhinnorhea.      Mouth/Throat: Mucous membranes are moist.  Neck: No stridor.   Cardiovascular: Normal rate, regular rhythm.  Good peripheral circulation. Respiratory: Normal respiratory effort without tachypnea or retractions.  Lungs CTAB. Good air entry to the bases with no decreased or absent breath sounds. Musculoskeletal: Full range of motion to all extremities. No gross deformities appreciated. Neurologic:  Normal speech and language. No gross focal neurologic deficits are appreciated.  Skin:  Skin is warm, dry.  Dry skin with excoriations to both flexor and extensor surfaces of bilateral elbows and knees. Psychiatric: Mood and affect are normal. Speech and behavior are normal. Patient exhibits appropriate insight and judgement.   ____________________________________________   LABS (all labs ordered are listed, but only abnormal results are displayed)  Labs Reviewed - No data to display ____________________________________________  EKG   ____________________________________________  RADIOLOGY  No results found.  ____________________________________________    PROCEDURES  Procedure(s) performed:    Procedures    Medications - No data to display   ____________________________________________   INITIAL IMPRESSION / ASSESSMENT AND PLAN / ED COURSE  Pertinent labs & imaging results that were available during my care of the patient were reviewed by me and considered in my medical decision making (see chart for details).  Review of the [place] CSRS was performed in accordance of the [facility] prior to dispensing any controlled drugs.     Patient's diagnosis is consistent with eczema. Patient will be discharged home with prescriptions for Cetaphil and prednisone. Patient is to follow up with dermatology as directed.  Referral was given to Dr. Adolphus Birchwood.  Patient is given ED precautions to return to the ED for any worsening or new symptoms.     ____________________________________________  FINAL CLINICAL IMPRESSION(S) / ED DIAGNOSES  Final diagnoses:  Eczema, unspecified type      NEW MEDICATIONS STARTED DURING THIS VISIT:  ED Discharge Orders         Ordered    cetaphil (CETAPHIL)  lotion  As needed,   Status:  Discontinued     09/17/18 1645    predniSONE (DELTASONE) 50 MG tablet  Status:  Discontinued     09/17/18 1645    cetaphil (CETAPHIL) lotion  As needed     09/17/18 1706    predniSONE (DELTASONE) 50 MG tablet     09/17/18 1706              This chart was dictated using voice recognition software/Dragon. Despite best efforts to proofread, errors can occur which can change the meaning. Any change was purely unintentional.    Enid Derry, PA-C 09/17/18 2206    Emily Filbert, MD 09/17/18 [phone]

## 2018-09-17 NOTE — ED Triage Notes (Signed)
Hx bad eczema. Pt c/o rash to generalized body X 2 days. It itches in nature. Unsure if from eczema or allergic to something.  Unlabored. NAD. No fevers.

## 2018-09-21 ENCOUNTER — Other Ambulatory Visit: Payer: Self-pay

## 2018-09-21 ENCOUNTER — Emergency Department: Payer: 59

## 2018-09-21 ENCOUNTER — Emergency Department
Admission: EM | Admit: 2018-09-21 | Discharge: 2018-09-21 | Disposition: A | Discharge status: Home / Self Care | Payer: 59 | Attending: Emergency Medicine | Admitting: Emergency Medicine

## 2018-09-21 DIAGNOSIS — R42 Dizziness and giddiness: Secondary | ICD-10-CM | POA: Insufficient documentation

## 2018-09-21 DIAGNOSIS — R519 Headache, unspecified: Secondary | ICD-10-CM

## 2018-09-21 DIAGNOSIS — J45909 Unspecified asthma, uncomplicated: Secondary | ICD-10-CM | POA: Insufficient documentation

## 2018-09-21 DIAGNOSIS — R51 Headache: Secondary | ICD-10-CM | POA: Insufficient documentation

## 2018-09-21 LAB — BASIC METABOLIC PANEL
Anion gap: 6 (ref 5–15)
BUN: 15 mg/dL (ref 6–20)
CO2: 29 mmol/L (ref 22–32)
Calcium: 9.4 mg/dL (ref 8.9–10.3)
Chloride: 104 mmol/L (ref 98–111)
Creatinine, Ser: 1.01 mg/dL (ref 0.61–1.24)
GFR calc Af Amer: >60 mL/min (ref >60)
GFR calc non Af Amer: >60 mL/min (ref >60)
Glucose, Bld: 98 mg/dL (ref 70–99)
Potassium: 4 mmol/L (ref 3.5–5.1)
Sodium: 139 mmol/L (ref 135–145)

## 2018-09-21 LAB — CBC
HCT: 38.7 % — ABNORMAL LOW (ref 39.0–52.0)
Hemoglobin: 13.5 g/dL (ref 13.0–17.0)
MCH: 28.7 pg (ref 26.0–34.0)
MCHC: 34.9 g/dL (ref 30.0–36.0)
MCV: 82.2 fL (ref 80.0–100.0)
Platelets: 258 10*3/uL (ref 150–400)
RBC: 4.71 MIL/uL (ref 4.22–5.81)
RDW: 11.7 % (ref 11.5–15.5)
WBC: 5.6 10*3/uL (ref 4.0–10.5)
nRBC: 0 % (ref 0.0–0.2)

## 2018-09-21 MED ORDER — SODIUM CHLORIDE 0.9 % IV BOLUS
1000.0000 mL | Freq: Once | INTRAVENOUS | Status: AC
  Administered 2018-09-21: 1000 mL via INTRAVENOUS

## 2018-09-21 MED ORDER — KETOROLAC TROMETHAMINE 30 MG/ML IJ SOLN
30.0000 mg | Freq: Once | INTRAMUSCULAR | Status: AC
  Administered 2018-09-21: 30 mg via INTRAVENOUS
  Filled 2018-09-21: qty 1

## 2018-09-21 MED ORDER — ONDANSETRON HCL 4 MG/2ML IJ SOLN
4.0000 mg | Freq: Once | INTRAMUSCULAR | Status: AC
  Administered 2018-09-21: 4 mg via INTRAVENOUS
  Filled 2018-09-21: qty 2

## 2018-09-21 MED ORDER — SODIUM CHLORIDE 0.9% FLUSH: 3.0000 mL | Freq: Once | INTRAVENOUS | Status: DC

## 2018-09-21 MED ORDER — DIPHENHYDRAMINE HCL 50 MG/ML IJ SOLN
25.0000 mg | Freq: Once | INTRAMUSCULAR | Status: AC
  Administered 2018-09-21: 25 mg via INTRAVENOUS
  Filled 2018-09-21: qty 1

## 2018-09-21 NOTE — ED Notes (Signed)
Pt presents with sore throat that started yesterday; this morning awoke with headache and dizziness. Denies n/v/d Pt alert & oriented with nad noted.

## 2018-09-21 NOTE — ED Triage Notes (Addendum)
Pt c/o sore throat with HA, dizziness and generalized not feeling well for the past couple of days. Denies fever, body aches, SOB or any other sx.

## 2018-09-21 NOTE — ED Provider Notes (Signed)
[facility] Emergency Department Provider Note  ____________________________________________  Time seen: Approximately 7:22 PM  I have reviewed the triage vital signs and the nursing notes.   HISTORY  Chief Complaint Dizziness    HPI Eric Mills is a 23 y.o. male that presents to the emergency department for evaluation of headache and lightheadedness and overall just not feeling well for 2 days.  Patient started prednisone 4 days ago for eczema.  He has taken prednisone in the past without any difficulty.  His eczema has resolved.  He has had no recent illness. No trauma.  He has a history of asthma and seasonal allergies.  No shortness of breath, chest pain, nausea, vomiting, abdominal pain, vomiting.   Past Medical History:  Diagnosis Date  . Asthma   . Attention deficit disorder (ADD)   . Seasonal allergies     There are no active problems to display for this patient.   Past Surgical History:  Procedure Laterality Date  . ANKLE SURGERY      Prior to Admission medications   Medication Sig Start Date End Date Taking? Authorizing Provider  predniSONE (DELTASONE) 50 MG tablet Take 1 tablet per day 09/17/18  Yes Enid Derry, PA-C    Allergies Fish allergy; Milk-related compounds; and Shellfish allergy  No family history on file.  Social History Social History   Tobacco Use  . Smoking status: Never Smoker  . Smokeless tobacco: Never Used  Substance Use Topics  . Alcohol use: No  . Drug use: No     Review of Systems  Constitutional: No fever/chills Cardiovascular: No chest pain. Respiratory: No SOB. Gastrointestinal: No abdominal pain.  No nausea, no vomiting.  Musculoskeletal: Negative for musculoskeletal pain. Skin: Negative for rash, abrasions, lacerations, ecchymosis. Neurological: Negative for numbness or tingling.  Positive for headache.   ____________________________________________   PHYSICAL EXAM:  VITAL SIGNS: ED  Triage Vitals  Enc Vitals Group     BP 09/21/18 1808 (!) 120/57     Pulse Rate 09/21/18 1808 63     Resp 09/21/18 1808 14     Temp 09/21/18 1808 98 F (36.7 C)     Temp src --      SpO2 09/21/18 1808 100 %     Weight 09/21/18 1803 200 lb (90.7 kg)     Height 09/21/18 1803  (1.753 m)     Head Circumference --      Peak Flow --      Pain Score 09/21/18 1803 10     Pain Loc --      Pain Edu? --      Excl. in GC? --      Constitutional: Alert and oriented. Well appearing and in no acute distress. Eyes: Conjunctivae are normal. PERRL. EOMI. Head: Atraumatic. ENT:      Ears:      Nose: No congestion/rhinnorhea.      Mouth/Throat: Mucous membranes are moist.  Neck: No stridor. Cardiovascular: Normal rate, regular rhythm.  Good peripheral circulation. Respiratory: Normal respiratory effort without tachypnea or retractions. Lungs CTAB. Good air entry to the bases with no decreased or absent breath sounds. Gastrointestinal: Bowel sounds 4 quadrants. Soft and nontender to palpation. No guarding or rigidity. No palpable masses. No distention. Musculoskeletal: Full range of motion to all extremities. No gross deformities appreciated. Neurologic:  Normal speech and language. No gross focal neurologic deficits are appreciated.  Skin:  Skin is warm, dry and intact. No rash noted. Psychiatric: Mood and affect  are normal. Speech and behavior are normal. Patient exhibits appropriate insight and judgement.   ____________________________________________   LABS (all labs ordered are listed, but only abnormal results are displayed)  Labs Reviewed  CBC - Abnormal; Notable for the following components:      Result Value   HCT 38.7 (*)    All other components within normal limits  BASIC METABOLIC PANEL  CBG MONITORING, ED   ____________________________________________  EKG  SB ____________________________________________  RADIOLOGY Lexine Baton, personally viewed and  evaluated these images (plain radiographs) as part of my medical decision making, as well as reviewing the written report by the radiologist.  Ct Head Wo Contrast  Result Date: 09/21/2018 CLINICAL DATA:  Headache with dizziness. Not feeling well for 2 days. EXAM: CT HEAD WITHOUT CONTRAST TECHNIQUE: Contiguous axial images were obtained from the base of the skull through the vertex without intravenous contrast. COMPARISON:  None. FINDINGS: Brain: No evidence of acute infarction, hemorrhage, hydrocephalus, extra-axial collection or mass lesion/mass effect. Normal cerebral volume. No white matter disease. Vascular: No hyperdense vessel or unexpected calcification. Skull: Normal variant calvarial abnormality, biparietal foramina, are unusually large, measuring approximately 2 cm in diameter. No underlying brain abnormalities or significant scalp findings. No fracture. Sinuses/Orbits: Paranasal sinuses are clear.  Negative orbits. Other: None. IMPRESSION: 1. No acute or focal intracranial abnormality. 2. Unusual variant calvarial abnormality, biparietal foramina, without underlying brain abnormalities. Electronically Signed   By: Elsie Stain M.D.   On: 09/21/2018 20:02   Dg Chest Portable 1 View  Result Date: 09/21/2018 CLINICAL DATA:  23 y/o M; sore throat, headache, dizziness, generalized not feeling well for the past couple of days. EXAM: PORTABLE CHEST 1 VIEW COMPARISON:  None. FINDINGS: The heart size and mediastinal contours are within normal limits. Both lungs are clear. The visualized skeletal structures are unremarkable. IMPRESSION: No active disease. Electronically Signed   By: Mitzi Hansen M.D.   On: 09/21/2018 19:54    ____________________________________________    PROCEDURES  Procedure(s) performed:    Procedures    Medications  sodium chloride flush (NS) 0.9 % injection 3 mL (3 mLs Intravenous Not Given 09/21/18 1900)  sodium chloride 0.9 % bolus 1,000 mL (0 mLs  Intravenous Stopped 09/21/18 2113)  diphenhydrAMINE (BENADRYL) injection 25 mg (25 mg Intravenous Given 09/21/18 1941)  ketorolac (TORADOL) 30 MG/ML injection 30 mg (30 mg Intravenous Given 09/21/18 2112)  ondansetron (ZOFRAN) injection 4 mg (4 mg Intravenous Given 09/21/18 2111)     ____________________________________________   INITIAL IMPRESSION / ASSESSMENT AND PLAN / ED COURSE  Pertinent labs & imaging results that were available during my care of the patient were reviewed by me and considered in my medical decision making (see chart for details).  Review of the [place] CSRS was performed in accordance of the [facility] prior to dispensing any controlled drugs.     Patient presented the emergency department for evaluation of headache and dizziness for 2 days.  Symptoms may be a side effect from prednisone.  Vital signs and exam are reassuring.  Symptoms improved completely with fluids, Benadryl, toradol, zofran.  CT negative for acute abnormalities.  CT shows AN unusual skull abnormality.  He will follow-up with neurosurgery for this.  Chest x-ray negative for acute cardiopulmonary processes.  Lab work within normal limits.  Case was discussed with Dr. Juliette Alcide and he is agreeable with plan of care.  Patient is to follow up with PCP as directed. Patient is given ED precautions to return to the ED  for any worsening or new symptoms.     ____________________________________________  FINAL CLINICAL IMPRESSION(S) / ED DIAGNOSES  Final diagnoses:  Acute nonintractable headache, unspecified headache type      NEW MEDICATIONS STARTED DURING THIS VISIT:  ED Discharge Orders    None          This chart was dictated using voice recognition software/Dragon. Despite best efforts to proofread, errors can occur which can change the meaning. Any change was purely unintentional.    Enid DerryWagner, Jihad Brownlow, PA-C 09/21/18 2127    Arnaldo NatalMalinda, Paul F, MD 09/21/18 [phone]

## 2018-09-21 NOTE — Discharge Instructions (Addendum)
Please discontinue your prednisone.  Your CT shows an abnormal variant in your skull and you have 2 holes in the back of your skull.  I have referred you to neurosurgery to discuss this.  Please wear a helmet anytime you play any sports.  Please return to the emergency department for any worsening symptoms.  Follow-up with primary care.

## 2018-09-21 NOTE — ED Notes (Signed)
Patient transported to CT at this time. 

## 2018-09-27 ENCOUNTER — Emergency Department (HOSPITAL_COMMUNITY)
Admission: EM | Admit: 2018-09-27 | Discharge: 2018-09-27 | Disposition: A | Discharge status: Home / Self Care | Payer: Self-pay | Attending: Emergency Medicine | Admitting: Emergency Medicine

## 2018-09-27 ENCOUNTER — Other Ambulatory Visit: Payer: Self-pay

## 2018-09-27 ENCOUNTER — Encounter (HOSPITAL_COMMUNITY): Payer: Self-pay | Admitting: Emergency Medicine

## 2018-09-27 DIAGNOSIS — L209 Atopic dermatitis, unspecified: Secondary | ICD-10-CM | POA: Insufficient documentation

## 2018-09-27 DIAGNOSIS — J4541 Moderate persistent asthma with (acute) exacerbation: Secondary | ICD-10-CM | POA: Insufficient documentation

## 2018-09-27 DIAGNOSIS — J302 Other seasonal allergic rhinitis: Secondary | ICD-10-CM | POA: Insufficient documentation

## 2018-09-27 MED ORDER — AEROCHAMBER PLUS FLO-VU LARGE MISC: 1.0000 | Freq: Once | Status: DC

## 2018-09-27 MED ORDER — CETIRIZINE HCL 10 MG PO TABS: 10.0000 mg | ORAL_TABLET | Freq: Every day | ORAL | 1 refills | Status: DC

## 2018-09-27 MED ORDER — MONTELUKAST SODIUM 10 MG PO TABS: 10.0000 mg | ORAL_TABLET | Freq: Every day | ORAL | 1 refills | Status: DC

## 2018-09-27 MED ORDER — ALBUTEROL SULFATE HFA 108 (90 BASE) MCG/ACT IN AERS: 1.0000 | INHALATION_SPRAY | Freq: Four times a day (QID) | RESPIRATORY_TRACT | 0 refills | Status: DC | PRN

## 2018-09-27 MED ORDER — AEROCHAMBER PLUS FLO-VU MEDIUM MISC
1.0000 | Freq: Once | Status: DC
  Filled 2018-09-27: qty 1

## 2018-09-27 MED ORDER — PREDNISONE 10 MG PO TABS: ORAL_TABLET | ORAL | 0 refills | Status: AC

## 2018-09-27 MED ORDER — ALBUTEROL SULFATE HFA 108 (90 BASE) MCG/ACT IN AERS
10.0000 | INHALATION_SPRAY | Freq: Once | RESPIRATORY_TRACT | Status: AC
  Administered 2018-09-27: 10 via RESPIRATORY_TRACT
  Filled 2018-09-27: qty 6.7

## 2018-09-27 MED ORDER — IPRATROPIUM BROMIDE HFA 17 MCG/ACT IN AERS
4.0000 | INHALATION_SPRAY | Freq: Once | RESPIRATORY_TRACT | Status: AC
  Administered 2018-09-27: 4 via RESPIRATORY_TRACT
  Filled 2018-09-27: qty 12.9

## 2018-09-27 MED ORDER — TRIAMCINOLONE ACETONIDE 0.1 % EX CREA: 1.0000 "application " | TOPICAL_CREAM | Freq: Two times a day (BID) | CUTANEOUS | 0 refills | Status: DC

## 2018-09-27 NOTE — Discharge Instructions (Addendum)
Please read and follow all provided instructions.  Your diagnoses today include:  1. Atopic dermatitis, unspecified type   2. Moderate persistent asthma with exacerbation   3. Seasonal allergic rhinitis, unspecified trigger     Tests performed today include: TESTS Vital signs. See below for your results today.   Medications prescribed:   Take any prescribed medications only as directed.  Please contact me because of your albuterol inhaler as needed for wheezing and shortness of breath.  Please maintain good dietary potassium while taking this medication. Some good sources of potassium include:   -1 cup cooked acorn squash -1 baked potato with skin -1 cup cooked spinach -1 cup cooked lentils -1 cup cooked kidney beans -Watermelon - cup raisins -1 cup plain yogurt -1 cup orange juice, frozen -Banana -1 cup 1% low-fat milk -1 cup cooked broccoli  Please take the tapering course of prednisone.  It is very important to taper this medication since you have had several courses of prednisone recently.   Common side effects include upset stomach/nausea. You may take this medicine with food if this occurs. Other side effects include restlessness, difficulty sleeping, and increased sweating. Call your healthcare provider if these do not resolve after finishing the medication.  This medicine may increase your blood sugar so additional careful monitoring is needed of blood sugar if you have diabetes. Call your healthcare provider for any signs/symtpoms of high blood sugar such as confusion, feeling sleepy, more thirst, more hunger, passing urine more often, flushing, fast breathing, or breath that smells like fruit.  You may apply the Kenalog cream to the areas of greatest itching twice a day for a week.  Please avoid applying for the face or any other mucous membranes like the groin.  Please start taking the montelukast once daily at bedtime.  Please start taking the Singulair once  daily.  Home care instructions:  Follow any educational materials contained in this packet.  Follow-up instructions: Please follow-up with your primary care provider in the next 3 days for further evaluation of your symptoms and management of your asthma.  Return instructions:  Please return to the Emergency Department if you experience worsening symptoms. Please return with worsening wheezing, shortness of breath, or difficulty breathing. Return with persistent fever above 101F.  Please return if you have any other emergent concerns.  Additional Information:  Your vital signs today were: BP 132/62 (BP Location: Right Arm)    Pulse (!) 54    Temp 98.5 F (36.9 C) (Oral)    Resp 16    Ht 5\' 9"  (1.753 m)    Wt 90.7 kg    SpO2 99%    BMI 29.53 kg/m  If your blood pressure (BP) was elevated above 135/85 this visit, please have this repeated by your doctor within one month. --------------

## 2018-09-27 NOTE — ED Triage Notes (Signed)
Patient present with expiratory wheezing and generalized rash to upper and lower extremities, torso and face. Complaints of difficultly catching breath for days now. Recent treatment with Prednisone temporarily worked. No home treatment for asthma.  Airway intact.

## 2018-09-27 NOTE — ED Provider Notes (Signed)
[facility] EMERGENCY DEPARTMENT Provider Note   CSN: [id number] Arrival date & time: 09/27/18  1359    History   Chief Complaint Chief Complaint  Patient presents with  . Asthma    HPI Eric Mills is a 23 y.o. male.     HPI   Patient is a 23 year old male with past medical history of allergic rhinitis, asthma, eczema, and ADD presenting for diffuse, pruritic and dry rash on flexor surfaces of skin, periorbital region, and ongoing wheezing.  Patient reports that he is struggled with control of his asthma and eczema since childhood.  He is no longer on any medications because he does not have a primary care provider.  He used to take Zyrtec, Singulair, and albuterol.  He was recently treated with a 5-day course of prednisone 10 days ago which he reports briefly assisted with the wheezing and eczema however it returned.  He denies any significant shortness of breath, productive cough or fevers.  Denies any exposure to individuals positive for COVID-19.  Past Medical History:  Diagnosis Date  . Asthma   . Attention deficit disorder (ADD)   . Seasonal allergies     There are no active problems to display for this patient.   Past Surgical History:  Procedure Laterality Date  . ANKLE SURGERY          Home Medications    Prior to Admission medications   Medication Sig Start Date End Date Taking? Authorizing Provider  predniSONE (DELTASONE) 50 MG tablet Take 1 tablet per day 09/17/18   Enid Derry, PA-C    Family History No family history on file.  Social History Social History   Tobacco Use  . Smoking status: Never Smoker  . Smokeless tobacco: Never Used  Substance Use Topics  . Alcohol use: No  . Drug use: No     Allergies   Fish allergy; Milk-related compounds; and Shellfish allergy   Review of Systems Review of Systems  Constitutional: Negative for chills and fever.  HENT: Positive for postnasal drip, rhinorrhea and sneezing.  Negative for congestion.   Respiratory: Positive for cough and wheezing.   Cardiovascular: Negative for chest pain.  Gastrointestinal: Negative for nausea and vomiting.  Skin: Positive for rash.  Neurological: Negative for headaches.     Physical Exam Updated Vital Signs BP 132/62 (BP Location: Right Arm)   Pulse (!) 54   Temp 98.5 F (36.9 C) (Oral)   Resp 16   Ht 5\' 9"  (1.753 m)   Wt 90.7 kg   SpO2 99%   BMI 29.53 kg/m   Physical Exam Vitals signs and nursing note reviewed.  Constitutional:      General: He is not in acute distress.    Appearance: He is well-developed. He is not diaphoretic.     Comments: Sitting comfortably in bed.  HENT:     Head: Normocephalic and atraumatic.  Eyes:     General:        Right eye: No discharge.        Left eye: No discharge.     Conjunctiva/sclera: Conjunctivae normal.     Comments: Patient has periorbital xerosis and hyperpigmentation. EOMs normal to gross examination.   Neck:     Musculoskeletal: Normal range of motion.  Cardiovascular:     Rate and Rhythm: Normal rate and regular rhythm.     Pulses: Normal pulses.  Pulmonary:     Effort: Pulmonary effort is normal.     Breath sounds:  Wheezing present.     Comments: Patient has declines, soft inspiratory/expiratory wheezes in all lung fields. Abdominal:     General: There is no distension.  Musculoskeletal: Normal range of motion.  Skin:    General: Skin is warm and dry.     Findings: Rash present.     Comments: Patient has diffuse xerosis of skin.  He has hyperpigmentation and thickening of skin overlying the antecubital fossa's bilaterally.  Patient has similar hyperpigmentation and xerosis with overlying scale bilateral proximal legs and popliteal region.  Neurological:     Mental Status: He is alert.     Comments: Cranial nerves intact to gross observation. Patient moves extremities without difficulty.  Psychiatric:        Behavior: Behavior normal.        Thought  Content: Thought content normal.        Judgment: Judgment normal.      ED Treatments / Results  Labs (all labs ordered are listed, but only abnormal results are displayed) Labs Reviewed - No data to display  EKG None  Radiology No results found.  Procedures Procedures (including critical care time)  Medications Ordered in ED Medications  albuterol (VENTOLIN HFA) 108 (90 Base) MCG/ACT inhaler 10 puff (has no administration in time range)  ipratropium (ATROVENT HFA) inhaler 4 puff (has no administration in time range)  AeroChamber Plus Flo-Vu Medium MISC 1 each (has no administration in time range)     Initial Impression / Assessment and Plan / ED Course  I have reviewed the triage vital signs and the nursing notes.  Pertinent labs & imaging results that were available during my care of the patient were reviewed by me and considered in my medical decision making (see chart for details).  Clinical Course as of Sep 26 1798  Sun Sep 27, 2018  1538 Spoke with Earley Abide, PharmD who states that combining topical and systemic steroids should be safe in this patient. I appreciate her involvement.    [AM]    Clinical Course User Index [AM] Elisha Ponder, PA-C       This is a well-appearing 23 year old male with past medical history of asthma allergic rhinitis, and eczema presenting for exacerbations of his wheezing, and eczematous eruption rash.  Reports his main complaint today is the rash.  Will place patient on topical steroids, additional steroid taper, albuterol, montelukast and cetirizine.  Patient has significant improvement after nebulizer dose equivalent with HFA of albuterol and Atrovent.  Wheezing nearly resolved.  He is given return precautions for any increasing shortness of breath, wheezing, or new worsening symptoms.  Patient is in understanding and agrees with the plan of care.  Final Clinical Impressions(s) / ED Diagnoses   Final diagnoses:  Atopic  dermatitis, unspecified type  Moderate persistent asthma with exacerbation  Seasonal allergic rhinitis, unspecified trigger    ED Discharge Orders         Ordered    albuterol (VENTOLIN HFA) 108 (90 Base) MCG/ACT inhaler  Every 6 hours PRN     09/27/18 1605    triamcinolone cream (KENALOG) 0.1 %  2 times daily     09/27/18 1605    predniSONE (DELTASONE) 10 MG tablet     09/27/18 1605    montelukast (SINGULAIR) 10 MG tablet  Daily at bedtime     09/27/18 1605    cetirizine (ZYRTEC) 10 MG tablet  Daily     09/27/18 1605  Elisha PonderMurray, Alyssa B, PA-C 09/27/18 1801    Sabas SousBero, Michael M, MD 09/28/18 1330

## 2018-10-26 ENCOUNTER — Encounter: Payer: Self-pay | Admitting: Intensive Care

## 2018-10-26 ENCOUNTER — Other Ambulatory Visit: Payer: Self-pay

## 2018-10-26 ENCOUNTER — Emergency Department
Admission: EM | Admit: 2018-10-26 | Discharge: 2018-10-26 | Disposition: A | Discharge status: Home / Self Care | Payer: Self-pay | Attending: Emergency Medicine | Admitting: Emergency Medicine

## 2018-10-26 DIAGNOSIS — T7840XA Allergy, unspecified, initial encounter: Secondary | ICD-10-CM | POA: Insufficient documentation

## 2018-10-26 DIAGNOSIS — J45909 Unspecified asthma, uncomplicated: Secondary | ICD-10-CM | POA: Insufficient documentation

## 2018-10-26 HISTORY — DX: Dermatitis, unspecified: L30.9

## 2018-10-26 MED ORDER — FAMOTIDINE 20 MG PO TABS
20.0000 mg | ORAL_TABLET | Freq: Once | ORAL | Status: AC
  Administered 2018-10-26: 20 mg via ORAL
  Filled 2018-10-26: qty 1

## 2018-10-26 MED ORDER — DIPHENHYDRAMINE HCL 50 MG/ML IJ SOLN
25.0000 mg | Freq: Once | INTRAMUSCULAR | Status: AC
  Administered 2018-10-26: 25 mg via INTRAMUSCULAR
  Filled 2018-10-26: qty 1

## 2018-10-26 MED ORDER — METHYLPREDNISOLONE SODIUM SUCC 125 MG IJ SOLR
125.0000 mg | Freq: Once | INTRAMUSCULAR | Status: AC
  Administered 2018-10-26: 125 mg via INTRAMUSCULAR
  Filled 2018-10-26: qty 2

## 2018-10-26 MED ORDER — DIPHENHYDRAMINE HCL 25 MG PO CAPS: 25.0000 mg | ORAL_CAPSULE | ORAL | 0 refills | Status: DC | PRN

## 2018-10-26 NOTE — Discharge Instructions (Signed)
Please continue Benadryl for the next 24 hours for reaction.

## 2018-10-26 NOTE — ED Provider Notes (Signed)
[facility] Emergency Department Provider Note  ____________________________________________  Time seen: Approximately 5:04 PM  I have reviewed the triage vital signs and the nursing notes.   HISTORY  Chief Complaint Allergic Reaction    HPI Eric Mills is a 23 y.o. male that presents to the emergency department for evaluation of itching to bilateral arms and legs since this morning.  Patient was cutting grass when symptoms started.  He is unsure if grass or weeds rubbed against his arms causing this reaction.  He did not come into any contact with poison ivy or poison oak.  He has seasonal allergies.  No new lotions, soaps, detergents.  He does not take any medications daily.  Overall he feels well.  He would like a note for work for today and tomorrow.  No fever, shortness of breath, chest pain, nausea, vomiting, abdominal pain.   Past Medical History:  Diagnosis Date  . Asthma   . Attention deficit disorder (ADD)   . Eczema   . Seasonal allergies     There are no active problems to display for this patient.   Past Surgical History:  Procedure Laterality Date  . ANKLE SURGERY      Prior to Admission medications   Medication Sig Start Date End Date Taking? Authorizing Provider  diphenhydrAMINE (BENADRYL) 25 mg capsule Take 1 capsule (25 mg total) by mouth every 4 (four) hours as needed. 10/26/18 10/26/19  Enid DerryWagner, Wanya Bangura, PA-C    Allergies Fish allergy, Milk-related compounds, Shellfish allergy, and Dust mite extract  History reviewed. No pertinent family history.  Social History Social History   Tobacco Use  . Smoking status: Never Smoker  . Smokeless tobacco: Never Used  Substance Use Topics  . Alcohol use: Yes    Comment: occ  . Drug use: No     Review of Systems  Constitutional: No fever/chills ENT: No upper respiratory complaints. Cardiovascular: No chest pain. Respiratory: No SOB. Gastrointestinal: No nausea, no vomiting.   Musculoskeletal: Negative for musculoskeletal pain. Skin: Negative for abrasions, lacerations, ecchymosis. Positive for rash.    ____________________________________________   PHYSICAL EXAM:  VITAL SIGNS: ED Triage Vitals [10/26/18 1619]  Enc Vitals Group     BP 129/76     Pulse Rate 78     Resp 16     Temp 98.8 F (37.1 C)     Temp Source Oral     SpO2 97 %     Weight 215 lb (97.5 kg)     Height 5\' 9"  (1.753 m)     Head Circumference      Peak Flow      Pain Score 10     Pain Loc      Pain Edu?      Excl. in GC?      Constitutional: Alert and oriented. Well appearing and in no acute distress. Eyes: Conjunctivae are normal. PERRL. EOMI. Head: Atraumatic. ENT:      Ears:      Nose: No congestion/rhinnorhea.      Mouth/Throat: Mucous membranes are moist.  Neck: No stridor. Cardiovascular: Normal rate, regular rhythm.  Good peripheral circulation. Respiratory: Normal respiratory effort without tachypnea or retractions. Lungs CTAB. Good air entry to the bases with no decreased or absent breath sounds. Musculoskeletal: Full range of motion to all extremities. No gross deformities appreciated. Neurologic:  Normal speech and language. No gross focal neurologic deficits are appreciated.  Skin:  Skin is warm, dry and intact. 1/2 cm raw lesion  to bilateral proximal forearms.  Psychiatric: Mood and affect are normal. Speech and behavior are normal. Patient exhibits appropriate insight and judgement.   ____________________________________________   LABS (all labs ordered are listed, but only abnormal results are displayed)  Labs Reviewed - No data to display ____________________________________________  EKG   ____________________________________________  RADIOLOGY   No results found.  ____________________________________________    PROCEDURES  Procedure(s) performed:    Procedures    Medications  methylPREDNISolone sodium succinate (SOLU-MEDROL) 125  mg/2 mL injection 125 mg (125 mg Intramuscular Given 10/26/18 1734)  diphenhydrAMINE (BENADRYL) injection 25 mg (25 mg Intramuscular Given 10/26/18 1734)  famotidine (PEPCID) tablet 20 mg (20 mg Oral Given 10/26/18 1734)     ____________________________________________   INITIAL IMPRESSION / ASSESSMENT AND PLAN / ED COURSE  Pertinent labs & imaging results that were available during my care of the patient were reviewed by me and considered in my medical decision making (see chart for details).  Review of the [place] CSRS was performed in accordance of the [place] prior to dispensing any controlled drugs.     Patient's diagnosis is consistent with allergic reation.  Rash likely started from something patient was exposed to while cutting grass.  Patient was given IM Solu-Medrol, Benadryl, oral Pepcid for symptoms.  Patient will be discharged home with prescriptions for Benadryl. Patient is to follow up with primary care as directed. Patient is given ED precautions to return to the ED for any worsening or new symptoms.     ____________________________________________  FINAL CLINICAL IMPRESSION(S) / ED DIAGNOSES  Final diagnoses:  Allergic reaction, initial encounter      NEW MEDICATIONS STARTED DURING THIS VISIT:  ED Discharge Orders         Ordered    diphenhydrAMINE (BENADRYL) 25 mg capsule  Every 4 hours PRN     10/26/18 1732              This chart was dictated using voice recognition software/Dragon. Despite best efforts to proofread, errors can occur which can change the meaning. Any change was purely unintentional.    Laban Emperor, PA-C 10/26/18 2335    Carrie Mew, MD 10/28/18 [phone]

## 2018-10-26 NOTE — ED Triage Notes (Signed)
Patient reports he was outside cutting the grass today and now experiencing rash, itching and minimal swelling to bilateral arms.

## 2018-11-08 ENCOUNTER — Encounter: Payer: Self-pay | Admitting: Emergency Medicine

## 2018-11-08 ENCOUNTER — Emergency Department: Payer: Self-pay

## 2018-11-08 ENCOUNTER — Other Ambulatory Visit: Payer: Self-pay

## 2018-11-08 DIAGNOSIS — R0789 Other chest pain: Secondary | ICD-10-CM | POA: Insufficient documentation

## 2018-11-08 DIAGNOSIS — Z5321 Procedure and treatment not carried out due to patient leaving prior to being seen by health care provider: Secondary | ICD-10-CM | POA: Insufficient documentation

## 2018-11-08 LAB — BASIC METABOLIC PANEL
Anion gap: 5 (ref 5–15)
BUN: 15 mg/dL (ref 6–20)
CO2: 29 mmol/L (ref 22–32)
Calcium: 9.1 mg/dL (ref 8.9–10.3)
Chloride: 104 mmol/L (ref 98–111)
Creatinine, Ser: 1 mg/dL (ref 0.61–1.24)
GFR calc Af Amer: >60 mL/min (ref >60)
GFR calc non Af Amer: >60 mL/min (ref >60)
Glucose, Bld: 109 mg/dL — ABNORMAL HIGH (ref 70–99)
Potassium: 3.5 mmol/L (ref 3.5–5.1)
Sodium: 138 mmol/L (ref 135–145)

## 2018-11-08 LAB — CBC
HCT: 41.5 % (ref 39.0–52.0)
Hemoglobin: 13.9 g/dL (ref 13.0–17.0)
MCH: 28.7 pg (ref 26.0–34.0)
MCHC: 33.5 g/dL (ref 30.0–36.0)
MCV: 85.6 fL (ref 80.0–100.0)
Platelets: 229 10*3/uL (ref 150–400)
RBC: 4.85 MIL/uL (ref 4.22–5.81)
RDW: 12 % (ref 11.5–15.5)
WBC: 6.2 10*3/uL (ref 4.0–10.5)
nRBC: 0 % (ref 0.0–0.2)

## 2018-11-08 LAB — TROPONIN I (HIGH SENSITIVITY): Troponin I (High Sensitivity): 5 ng/L (ref <18)

## 2018-11-08 NOTE — ED Triage Notes (Signed)
Patient with complaint of intermittent central chest pain and chills that started last night. Patient denies shortness of breath and nausea/vomiting.

## 2018-11-09 ENCOUNTER — Emergency Department
Admission: EM | Admit: 2018-11-09 | Discharge: 2018-11-09 | Disposition: A | Discharge status: Home / Self Care | Payer: Self-pay | Attending: Emergency Medicine | Admitting: Emergency Medicine

## 2018-11-09 ENCOUNTER — Telehealth: Payer: Self-pay | Admitting: Emergency Medicine

## 2018-11-09 NOTE — Telephone Encounter (Signed)
Called patient due to lwot to inquire about condition and follow up plans. Left message.   

## 2018-11-11 ENCOUNTER — Other Ambulatory Visit: Payer: Self-pay

## 2018-11-11 ENCOUNTER — Emergency Department
Admission: EM | Admit: 2018-11-11 | Discharge: 2018-11-11 | Disposition: A | Discharge status: Home / Self Care | Payer: Self-pay | Attending: Student in an Organized Health Care Education/Training Program | Admitting: Student in an Organized Health Care Education/Training Program

## 2018-11-11 ENCOUNTER — Encounter: Payer: Self-pay | Admitting: Emergency Medicine

## 2018-11-11 DIAGNOSIS — Z79899 Other long term (current) drug therapy: Secondary | ICD-10-CM | POA: Insufficient documentation

## 2018-11-11 DIAGNOSIS — J45909 Unspecified asthma, uncomplicated: Secondary | ICD-10-CM | POA: Insufficient documentation

## 2018-11-11 DIAGNOSIS — A6001 Herpesviral infection of penis: Secondary | ICD-10-CM | POA: Insufficient documentation

## 2018-11-11 MED ORDER — HYDROXYZINE HCL 25 MG PO TABS: 25.0000 mg | ORAL_TABLET | Freq: Four times a day (QID) | ORAL | 0 refills | Status: DC | PRN

## 2018-11-11 MED ORDER — ACYCLOVIR 400 MG PO TABS: 400.0000 mg | ORAL_TABLET | Freq: Every day | ORAL | 0 refills | Status: AC

## 2018-11-11 NOTE — ED Provider Notes (Signed)
[facility] Emergency Department Provider Note  ____________________________________________   First MD Initiated Contact with Patient 11/11/18 1249     (approximate)  I have reviewed the triage vital signs and the nursing notes.   HISTORY  Chief Complaint Rash   HPI Eric Mills is a 23 y.o. male presents to the ED with complaint of rash that is worse on his penis.  Patient was seen in the ED on 10/26/2018 also for rash.  At that time he was given Pepcid, Solu-Medrol and Benadryl.  Patient denies any shortness of breath.  He states he is very uncomfortable.  He rates his pain as a 10/10.      Past Medical History:  Diagnosis Date  . Asthma   . Attention deficit disorder (ADD)   . Eczema   . Seasonal allergies     There are no active problems to display for this patient.   Past Surgical History:  Procedure Laterality Date  . ANKLE SURGERY      Prior to Admission medications   Medication Sig Start Date End Date Taking? Authorizing Provider  acyclovir (ZOVIRAX) 400 MG tablet Take 1 tablet (400 mg total) by mouth 5 (five) times daily for 10 days. 11/11/18 11/21/18  Johnn Hai, PA-C  diphenhydrAMINE (BENADRYL) 25 mg capsule Take 1 capsule (25 mg total) by mouth every 4 (four) hours as needed. 10/26/18 10/26/19  Laban Emperor, PA-C  hydrOXYzine (ATARAX/VISTARIL) 25 MG tablet Take 1 tablet (25 mg total) by mouth every 6 (six) hours as needed for itching. 11/11/18   Johnn Hai, PA-C    Allergies Fish allergy, Milk-related compounds, Shellfish allergy, and Dust mite extract  No family history on file.  Social History Social History   Tobacco Use  . Smoking status: Never Smoker  . Smokeless tobacco: Never Used  Substance Use Topics  . Alcohol use: Yes    Comment: occ  . Drug use: No    Review of Systems Constitutional: No fever/chills Cardiovascular: Denies chest pain. Respiratory: Denies shortness of breath. Genitourinary:  Negative for dysuria. Musculoskeletal: Negative for back pain. Skin: Positive for rash. Neurological: Negative for  focal weakness or numbness. ____________________________________________   PHYSICAL EXAM:  VITAL SIGNS: ED Triage Vitals  Enc Vitals Group     BP 11/11/18 1245 (!) 142/69     Pulse Rate 11/11/18 1245 75     Resp 11/11/18 1245 17     Temp 11/11/18 1245 98 F (36.7 C)     Temp Source 11/11/18 1245 Oral     SpO2 11/11/18 1245 99 %     Weight 11/11/18 1229 220 lb (99.8 kg)     Height 11/11/18 1229 5\' 9"  (1.753 m)     Head Circumference --      Peak Flow --      Pain Score 11/11/18 1228 10     Pain Loc --      Pain Edu? --      Excl. in [place]? --     Constitutional: Alert and oriented. Well appearing and in no acute distress. Eyes: Conjunctivae are normal.  Head: Atraumatic. Neck: No stridor.   Cardiovascular: Normal rate, regular rhythm. Grossly normal heart sounds.  Good peripheral circulation. Respiratory: Normal respiratory effort.  No retractions. Lungs CTAB. Gastrointestinal: Soft and nontender. No distention.  Musculoskeletal: Moves upper and lower extremities without any difficulty.  Normal gait was noted. Neurologic:  Normal speech and language. No gross focal neurologic deficits are appreciated. No gait  instability. Skin:  Skin is warm, dry.  Patient has a few open areas on his upper medial thigh but stated that it was much worse on his penis.  He was reluctant to let this provider look.  Durward Parcelon Smith, PA-C was into see the patient who did let him be examined.  There was multiple open areas and patient states earlier they were water blisters.  Some areas are scabbed over. Psychiatric: Mood and affect are normal. Speech and behavior are normal.  ____________________________________________   LABS (all labs ordered are listed, but only abnormal results are displayed)  Labs Reviewed - No data to display  PROCEDURES  Procedure(s) performed (including  Critical Care):  Procedures   ____________________________________________   INITIAL IMPRESSION / ASSESSMENT AND PLAN / ED COURSE  As part of my medical decision making, I reviewed the following data within the electronic MEDICAL RECORD NUMBER Notes from prior ED visits and [place] Controlled Substance Database  23 year old male presents to the ED with complaint of rash that is much worse on his penis.  Patient states he has been taking over-the-counter medication without any relief.  Physical exam was consistent with herpes genitalia.  Patient was given prescription for Zovirax 400 mg 5 times a day for 10 days.  He was also given a prescription for Vistaril as needed for itching.  Patient is to follow-up with his PCP if any continued problems or concerns.  ____________________________________________   FINAL CLINICAL IMPRESSION(S) / ED DIAGNOSES  Final diagnoses:  Herpes simplex infection of penis     ED Discharge Orders         Ordered    acyclovir (ZOVIRAX) 400 MG tablet  5 times daily     11/11/18 1309    hydrOXYzine (ATARAX/VISTARIL) 25 MG tablet  Every 6 hours PRN     11/11/18 1309           Note:  This document was prepared using Dragon voice recognition software and may include unintentional dictation errors.    Tommi RumpsSummers, Evart Mcdonnell L, PA-C 11/11/18 1341    Willy Eddyobinson, Patrick, MD 11/11/18 1350

## 2018-11-11 NOTE — ED Triage Notes (Signed)
Pt reports rash for 3 days over body, worse between fingers that itches worse at night for the last 3 days.

## 2018-11-11 NOTE — Discharge Instructions (Signed)
Follow-up with your primary care provider in [place] if any continued problems.  Begin taking medication as directed Zovirax 1 tablet 5 times a day for the next 10 days.  Also you may take Vistaril as needed for itching or discomfort.

## 2018-12-22 ENCOUNTER — Emergency Department
Admission: EM | Admit: 2018-12-22 | Discharge: 2018-12-22 | Disposition: A | Discharge status: Home / Self Care | Payer: Self-pay | Attending: Emergency Medicine | Admitting: Emergency Medicine

## 2018-12-22 ENCOUNTER — Encounter: Payer: Self-pay | Admitting: *Deleted

## 2018-12-22 ENCOUNTER — Other Ambulatory Visit: Payer: Self-pay

## 2018-12-22 DIAGNOSIS — J45909 Unspecified asthma, uncomplicated: Secondary | ICD-10-CM | POA: Insufficient documentation

## 2018-12-22 DIAGNOSIS — L0100 Impetigo, unspecified: Secondary | ICD-10-CM | POA: Insufficient documentation

## 2018-12-22 DIAGNOSIS — L309 Dermatitis, unspecified: Secondary | ICD-10-CM | POA: Insufficient documentation

## 2018-12-22 DIAGNOSIS — Z79899 Other long term (current) drug therapy: Secondary | ICD-10-CM | POA: Insufficient documentation

## 2018-12-22 MED ORDER — PREDNISONE 20 MG PO TABS
60.0000 mg | ORAL_TABLET | Freq: Once | ORAL | Status: AC
  Administered 2018-12-22: 60 mg via ORAL
  Filled 2018-12-22: qty 3

## 2018-12-22 MED ORDER — TRIAMCINOLONE ACETONIDE 0.1 % EX OINT: 1.0000 "application " | TOPICAL_OINTMENT | Freq: Two times a day (BID) | CUTANEOUS | 1 refills | Status: DC

## 2018-12-22 MED ORDER — PREDNISONE 20 MG PO TABS: 20.0000 mg | ORAL_TABLET | Freq: Two times a day (BID) | ORAL | 0 refills | Status: AC

## 2018-12-22 MED ORDER — HYDROXYZINE HCL 25 MG PO TABS
25.0000 mg | ORAL_TABLET | Freq: Once | ORAL | Status: AC
  Administered 2018-12-22: 25 mg via ORAL
  Filled 2018-12-22: qty 1

## 2018-12-22 MED ORDER — CETIRIZINE HCL 10 MG PO TABS: 10.0000 mg | ORAL_TABLET | Freq: Every day | ORAL | 1 refills | Status: DC

## 2018-12-22 MED ORDER — MUPIROCIN 2 % EX OINT: TOPICAL_OINTMENT | CUTANEOUS | 0 refills | Status: DC

## 2018-12-22 NOTE — Discharge Instructions (Addendum)
You are being treated for eczema and local skin excoriations (scratching) that has led to open wounds. These open wounds are potential areas where staph can easily cause an infection. You should also avoid bathing/showering everyday. Take quick, lukewarm showers and pat the major water drops off of yourself. Use the Eucerin/triamcinolone ointment or petroleum jelly as a daily moisturizer. Follow-up with Dermatology for ongoing symptoms. Take the pills as directed.

## 2018-12-22 NOTE — ED Provider Notes (Signed)
[facility] Emergency Department Provider Note ____________________________________________  Time seen: 2220  I have reviewed the triage vital signs and the nursing notes.  HISTORY  Chief Complaint  Rash  HPI Eric Clearance Mills is a 23 y.o. male presents to the ED for concern over possible skin infection to the arms. The patient has severe, generalized eczema and has been admittedly scratching excessively. He now has open excoriations to the flexural surfaces of the arms and legs. He admits to daily hot showers, using cheap body soap, and not moisturizing as expected. He denies any fevers, nausea, purulent drainage, or skin weeping. He is not currently under the care of a primary provider or dermatologists.   Past Medical History:  Diagnosis Date  . Asthma   . Attention deficit disorder (ADD)   . Eczema   . Seasonal allergies     There are no active problems to display for this patient.   Past Surgical History:  Procedure Laterality Date  . ANKLE SURGERY      Prior to Admission medications   Medication Sig Start Date End Date Taking? Authorizing Provider  cetirizine (ZYRTEC) 10 MG tablet Take 1 tablet (10 mg total) by mouth daily. 12/22/18 01/21/19  Tylesha Gibeault, Charlesetta IvoryJenise V Bacon, PA-C  diphenhydrAMINE (BENADRYL) 25 mg capsule Take 1 capsule (25 mg total) by mouth every 4 (four) hours as needed. 10/26/18 10/26/19  Enid DerryWagner, Ashley, PA-C  hydrOXYzine (ATARAX/VISTARIL) 25 MG tablet Take 1 tablet (25 mg total) by mouth every 6 (six) hours as needed for itching. 11/11/18   Tommi RumpsSummers, Rhonda L, PA-C  mupirocin ointment (BACTROBAN) 2 % Apply to affected area 3 times daily 12/22/18   Samanthia Howland, Charlesetta IvoryJenise V Bacon, PA-C  predniSONE (DELTASONE) 20 MG tablet Take 1 tablet (20 mg total) by mouth 2 (two) times daily with a meal for 5 days. 12/22/18 12/27/18  Andrick Rust, Charlesetta IvoryJenise V Bacon, PA-C  triamcinolone ointment (KENALOG) 0.1 % Apply 1 application topically 2 (two) times daily. 12/22/18   Jisella Ashenfelter,  Charlesetta IvoryJenise V Bacon, PA-C    Allergies Fish allergy, Milk-related compounds, Shellfish allergy, and Dust mite extract  History reviewed. No pertinent family history.  Social History Social History   Tobacco Use  . Smoking status: Never Smoker  . Smokeless tobacco: Never Used  Substance Use Topics  . Alcohol use: Yes    Comment: occ  . Drug use: No    Review of Systems  Constitutional: Negative for fever. Eyes: Negative for visual changes. ENT: Negative for sore throat. Cardiovascular: Negative for chest pain. Respiratory: Negative for shortness of breath. Gastrointestinal: Negative for abdominal pain, vomiting and diarrhea. Genitourinary: Negative for dysuria. Musculoskeletal: Negative for back pain. Skin: Positive for rash. Neurological: Negative for headaches, focal weakness or numbness. ____________________________________________  PHYSICAL EXAM:  VITAL SIGNS: ED Triage Vitals [12/22/18 2129]  Enc Vitals Group     BP (!) 161/92     Pulse Rate 73     Resp 16     Temp      Temp Source Oral     SpO2 99 %     Weight 220 lb 0.3 oz (99.8 kg)     Height 5\' 10"  (1.778 m)     Head Circumference      Peak Flow      Pain Score 9     Pain Loc      Pain Edu?      Excl. in [place]?     Constitutional: Alert and oriented. Well appearing and in no distress.  Head: Normocephalic and atraumatic. Eyes: Conjunctivae are normal. PERRL. Normal extraocular movements Cardiovascular: Normal rate, regular rhythm. Normal distal pulses. Respiratory: Normal respiratory effort.  Musculoskeletal: Nontender with normal range of motion in all extremities.  Neurologic:  Normal gait without ataxia. Normal speech and language. No gross focal neurologic deficits are appreciated. Skin:  Skin is warm, dry and intact. Patient with generalized eczematous skin changes from the face, trunk, and extremities. He has excoriations to the flexural surfaces of the elbows and knees, with local superficial  wounds. No induration, erythema, lymphangitis, or purulence.  ____________________________________________  PROCEDURES  Procedures Prednisone 60 gm PO Hydroxyzine 25 mg PO ____________________________________________  INITIAL IMPRESSION / ASSESSMENT AND PLAN / ED COURSE  Jabri Statzer was evaluated in Emergency Department on 12/22/2018 for the symptoms described in the history of present illness. He was evaluated in the context of the global COVID-19 pandemic, which necessitated consideration that the patient might be at risk for infection with the SARS-CoV-2 virus that causes COVID-19. Institutional protocols and algorithms that pertain to the evaluation of patients at risk for COVID-19 are in a state of rapid change based on information released by regulatory bodies including the CDC and federal and state organizations. These policies and algorithms were followed during the patient's care in the ED.  Patient with ED evaluation of eczema flare and local skin wounds. He is without signs of cellulitis, toxic appearance, or acute illness. He has severe, generalized eczema and was given general eczema care instructions. He will also be treated with prednisone, cetirizine, Bactroban ointment, and Eucerin/triam ointment. He is referred to Dermatology for definitive management.  ____________________________________________  FINAL CLINICAL IMPRESSION(S) / ED DIAGNOSES  Final diagnoses:  Eczema, unspecified type  Impetigo     Shelia Magallon, Dannielle Karvonen, PA-C 12/22/18 2332    Arta Silence, MD 12/26/18 [phone]

## 2018-12-22 NOTE — ED Triage Notes (Signed)
Pt to ED with rash on both arms after starting medicated lotion for eczema. Pt has multiple small sores and reports the open areas are itchy.

## 2018-12-22 NOTE — ED Notes (Signed)
Pt to the er for a rash all over the body. Pt has bumps over both arms and legs with very dry skin present. Pt states he is using cetaphil and the last time he used it, he developed a rash. Pt reports itching and took 2 benadryl tabs a few hours ago but it is not helping with the itching.

## 2019-02-04 ENCOUNTER — Other Ambulatory Visit: Payer: Self-pay

## 2019-02-04 ENCOUNTER — Encounter: Payer: Self-pay | Admitting: Emergency Medicine

## 2019-02-04 ENCOUNTER — Emergency Department: Payer: Self-pay

## 2019-02-04 ENCOUNTER — Emergency Department
Admission: EM | Admit: 2019-02-04 | Discharge: 2019-02-04 | Disposition: A | Discharge status: Home / Self Care | Payer: Self-pay | Attending: Emergency Medicine | Admitting: Emergency Medicine

## 2019-02-04 DIAGNOSIS — J209 Acute bronchitis, unspecified: Secondary | ICD-10-CM | POA: Insufficient documentation

## 2019-02-04 DIAGNOSIS — Z20822 Contact with and (suspected) exposure to covid-19: Secondary | ICD-10-CM

## 2019-02-04 DIAGNOSIS — Z20828 Contact with and (suspected) exposure to other viral communicable diseases: Secondary | ICD-10-CM | POA: Insufficient documentation

## 2019-02-04 DIAGNOSIS — R0600 Dyspnea, unspecified: Secondary | ICD-10-CM | POA: Insufficient documentation

## 2019-02-04 DIAGNOSIS — R05 Cough: Secondary | ICD-10-CM | POA: Insufficient documentation

## 2019-02-04 DIAGNOSIS — M7918 Myalgia, other site: Secondary | ICD-10-CM | POA: Insufficient documentation

## 2019-02-04 DIAGNOSIS — R070 Pain in throat: Secondary | ICD-10-CM | POA: Insufficient documentation

## 2019-02-04 MED ORDER — AZITHROMYCIN 250 MG PO TABS: ORAL_TABLET | ORAL | 0 refills | Status: DC

## 2019-02-04 MED ORDER — IPRATROPIUM-ALBUTEROL 0.5-2.5 (3) MG/3ML IN SOLN
3.0000 mL | Freq: Once | RESPIRATORY_TRACT | Status: AC
  Administered 2019-02-04: 3 mL via RESPIRATORY_TRACT
  Filled 2019-02-04: qty 3

## 2019-02-04 MED ORDER — PREDNISONE 10 MG (21) PO TBPK: ORAL_TABLET | ORAL | 0 refills | Status: DC

## 2019-02-04 NOTE — Discharge Instructions (Signed)
Follow-up with your regular doctor if not better in 3 days.  Return emergency department worsening.  Your test results should be back either tomorrow or the next day.  Remain out of work until you receive your results.  If they are negative he may return the next day.  If positive continue to remain quarantined for additional 10 days.

## 2019-02-04 NOTE — ED Provider Notes (Signed)
[facility] Emergency Department Provider Note  ____________________________________________   First MD Initiated Contact with Patient 02/04/19 1417     (approximate)  I have reviewed the triage vital signs and the nursing notes.   HISTORY  Chief Complaint URI    HPI Eric Mills is a 23 y.o. male presents emergency department with flulike symptoms, patient is complained of fever, chills, body aches.  cough, sore throat, denies vomiting, denies diarrhea; states when he lays down at night goes to sleep he has a lot of difficulty breathing.  Is concerned and tired because he feels like he will stop breathing.  Patient has history of asthma and does have an inhaler at home.  No known COVID exposure.  Patient does work  Past Medical History:  Diagnosis Date  . Asthma   . Attention deficit disorder (ADD)   . Eczema   . Seasonal allergies     There are no active problems to display for this patient.   Past Surgical History:  Procedure Laterality Date  . ANKLE SURGERY      Prior to Admission medications   Medication Sig Start Date End Date Taking? Authorizing Provider  azithromycin (ZITHROMAX Z-PAK) 250 MG tablet 2 pills today then 1 pill a day for 4 days 02/04/19   Sherrie Mustache Roselyn Bering, PA-C  cetirizine (ZYRTEC) 10 MG tablet Take 1 tablet (10 mg total) by mouth daily. 12/22/18 01/21/19  Menshew, Charlesetta Ivory, PA-C  diphenhydrAMINE (BENADRYL) 25 mg capsule Take 1 capsule (25 mg total) by mouth every 4 (four) hours as needed. 10/26/18 10/26/19  Enid Derry, PA-C  hydrOXYzine (ATARAX/VISTARIL) 25 MG tablet Take 1 tablet (25 mg total) by mouth every 6 (six) hours as needed for itching. 11/11/18   Tommi Rumps, PA-C  mupirocin ointment (BACTROBAN) 2 % Apply to affected area 3 times daily 12/22/18   Menshew, Charlesetta Ivory, PA-C  predniSONE (STERAPRED UNI-PAK 21 TAB) 10 MG (21) TBPK tablet Take 6 pills on day one then decrease by 1 pill each day 02/04/19    Faythe Ghee, PA-C  triamcinolone ointment (KENALOG) 0.1 % Apply 1 application topically 2 (two) times daily. 12/22/18   Menshew, Charlesetta Ivory, PA-C    Allergies Fish allergy, Milk-related compounds, Shellfish allergy, and Dust mite extract  No family history on file.  Social History Social History   Tobacco Use  . Smoking status: Never Smoker  . Smokeless tobacco: Never Used  Substance Use Topics  . Alcohol use: Yes  . Drug use: No    Review of Systems  Constitutional: Positive fever/chills Eyes: No visual changes. ENT: Positive sore throat.  Positive runny nose and congestion with yellow mucus Respiratory: Positive cough and some difficulty breathing Genitourinary: Negative for dysuria. Musculoskeletal: Negative for back pain. Skin: Negative for rash.    ____________________________________________   PHYSICAL EXAM:  VITAL SIGNS: ED Triage Vitals  Enc Vitals Group     BP 02/04/19 1340 (!) 156/83     Pulse Rate 02/04/19 1340 (!) 116     Resp 02/04/19 1340 16     Temp 02/04/19 1340 98.6 F (37 C)     Temp src --      SpO2 02/04/19 1340 98 %     Weight 02/04/19 1341 188 lb (85.3 kg)     Height 02/04/19 1341 5\' 10"  (1.778 m)     Head Circumference --      Peak Flow --      Pain Score  02/04/19 1341 10     Pain Loc --      Pain Edu? --      Excl. in [place]? --     Constitutional: Alert and oriented. Well appearing and in no acute distress. Eyes: Conjunctivae are normal.  Head: Atraumatic. Nose: Positive congestion/rhinnorhea. Mouth/Throat: Mucous membranes are moist.   Neck:  supple no lymphadenopathy noted Cardiovascular: Normal rate, regular rhythm. Heart sounds are normal Respiratory: Normal respiratory effort.  No retractions, lungs with wheezing bilaterally GU: deferred Musculoskeletal: FROM all extremities, warm and well perfused Neurologic:  Normal speech and language.  Skin:  Skin is warm, dry and intact. No rash noted. Psychiatric: Mood and  affect are normal. Speech and behavior are normal.  ____________________________________________   LABS (all labs ordered are listed, but only abnormal results are displayed)  Labs Reviewed  NOVEL CORONAVIRUS, NAA (HOSP ORDER, SEND-OUT TO REF LAB; TAT 18-24 HRS)   ____________________________________________   ____________________________________________  RADIOLOGY  Chest x-ray is normal  ____________________________________________   PROCEDURES  Procedure(s) performed: DuoNeb   Procedures    ____________________________________________   INITIAL IMPRESSION / ASSESSMENT AND PLAN / ED COURSE  Pertinent labs & imaging results that were available during my care of the patient were reviewed by me and considered in my medical decision making (see chart for details).   Patient is 23 year old male with history of asthma presents emergency department with flu COVID type symptoms.  See HPI  Physical exam patient has bilateral wheezing.  Remainder of exam is basically unremarkable other than elevated heart rate and blood pressure.  Chest x-ray is normal COVID test pending DuoNeb was performed with HEPA filter placed in the room  Patient was given a prescription for Z-Pak and steroid pack.  He is to quarantine at home until test results have been received.  Explained to him if they are positive he should remain out of work for an additional 10 days.  If negative he may return to work next day.  States he understands will comply.  Is discharged stable condition.     As part of my medical decision making, I reviewed the following data within the [place] notes reviewed and incorporated, Old chart reviewed, Radiograph reviewed chest x-ray is normal, Notes from prior ED visits and [place] Controlled Substance Database  ____________________________________________   FINAL CLINICAL IMPRESSION(S) / ED DIAGNOSES  Final diagnoses:  Acute bronchitis,  unspecified organism  Suspected COVID-19 virus infection      NEW MEDICATIONS STARTED DURING THIS VISIT:  New Prescriptions   AZITHROMYCIN (ZITHROMAX Z-PAK) 250 MG TABLET    2 pills today then 1 pill a day for 4 days   PREDNISONE (STERAPRED UNI-PAK 21 TAB) 10 MG (21) TBPK TABLET    Take 6 pills on day one then decrease by 1 pill each day     Note:  This document was prepared using Dragon voice recognition software and may include unintentional dictation errors.    Versie Starks, PA-C 02/04/19 1535    Lavonia Drafts, MD 02/08/19 [phone]

## 2019-02-04 NOTE — ED Triage Notes (Signed)
Pt in via POV, reports cough, sore throat, congestion x 2 days.  Ambulatory to triage, NAD noted at this time.

## 2019-02-05 LAB — NOVEL CORONAVIRUS, NAA (HOSP ORDER, SEND-OUT TO REF LAB; TAT 18-24 HRS): SARS-CoV-2, NAA: NOT DETECTED

## 2019-02-08 ENCOUNTER — Telehealth: Payer: Self-pay | Admitting: General Practice

## 2019-02-08 NOTE — Telephone Encounter (Signed)
Patient called for his Covid results. He was told that Covid was Not Detected. Patient stated understanding.

## 2019-02-15 ENCOUNTER — Emergency Department
Admission: EM | Admit: 2019-02-15 | Discharge: 2019-02-15 | Disposition: A | Discharge status: Home / Self Care | Payer: No Typology Code available for payment source | Attending: Student in an Organized Health Care Education/Training Program | Admitting: Student in an Organized Health Care Education/Training Program

## 2019-02-15 ENCOUNTER — Other Ambulatory Visit: Payer: Self-pay

## 2019-02-15 ENCOUNTER — Encounter: Payer: Self-pay | Admitting: Emergency Medicine

## 2019-02-15 DIAGNOSIS — J45909 Unspecified asthma, uncomplicated: Secondary | ICD-10-CM | POA: Insufficient documentation

## 2019-02-15 DIAGNOSIS — L2089 Other atopic dermatitis: Secondary | ICD-10-CM | POA: Insufficient documentation

## 2019-02-15 DIAGNOSIS — R21 Rash and other nonspecific skin eruption: Secondary | ICD-10-CM | POA: Diagnosis present

## 2019-02-15 DIAGNOSIS — L308 Other specified dermatitis: Secondary | ICD-10-CM

## 2019-02-15 MED ORDER — PREDNISONE 10 MG PO TABS: 60.0000 mg | ORAL_TABLET | Freq: Every day | ORAL | 0 refills | Status: DC

## 2019-02-15 MED ORDER — UREA 10 % EX CREA: TOPICAL_CREAM | Freq: Two times a day (BID) | CUTANEOUS | 0 refills | Status: DC

## 2019-02-15 MED ORDER — HYDROXYZINE HCL 25 MG PO TABS: 50.0000 mg | ORAL_TABLET | Freq: Four times a day (QID) | ORAL | 0 refills | Status: DC | PRN

## 2019-02-15 NOTE — Discharge Instructions (Signed)
I have extended the dosing of prednisone and increased the dosage of the anti itch medication.  One of the most important things that will help is to keep the skin moisturized. I have prescribed a moisturizer to be used twice per day. You may also use Aquaphor on the skin between applications. After showering, make sure some type of moisturizer is applied.   Please call [facility] and Dr. Charlotte Crumb office to request an appointment.

## 2019-02-15 NOTE — ED Notes (Signed)
See triage note  Presents with generalized fine rash   Positive itching  States this has been going on for several months

## 2019-02-15 NOTE — ED Triage Notes (Signed)
Rash to neck, arms, trunk, and legs.  STates symptoms started today.  Patient's skin is dry with white flaky rash seen.  NAD

## 2019-02-15 NOTE — ED Provider Notes (Signed)
[facility] Emergency Department Provider Note  ____________________________________________  Time seen: Approximately 5:37 PM  I have reviewed the triage vital signs and the nursing notes.   HISTORY  Chief Complaint Rash   HPI Eric Mills is a 23 y.o. male who presents to the emergency department for treatment and evaluation of rash to skin. Rash is chronic and is getting worse. No relief with steroid cream, vistaril, and prednisone. He is  Uninsured and has been unable to see dermatology. Rash is pruritic and is burning.   Past Medical History:  Diagnosis Date  . Asthma   . Attention deficit disorder (ADD)   . Eczema   . Seasonal allergies     There are no active problems to display for this patient.   Past Surgical History:  Procedure Laterality Date  . ANKLE SURGERY      Prior to Admission medications   Medication Sig Start Date End Date Taking? Authorizing Provider  cetirizine (ZYRTEC) 10 MG tablet Take 1 tablet (10 mg total) by mouth daily. 12/22/18 01/21/19  Menshew, Dannielle Karvonen, PA-C  hydrOXYzine (ATARAX/VISTARIL) 25 MG tablet Take 2 tablets (50 mg total) by mouth every 6 (six) hours as needed. 02/15/19   Victorino Dike, FNP  mupirocin ointment (BACTROBAN) 2 % Apply to affected area 3 times daily 12/22/18   Menshew, Dannielle Karvonen, PA-C  predniSONE (DELTASONE) 10 MG tablet Take 6 tablets (60 mg total) by mouth daily. 02/15/19   Armani Gawlik B, FNP  urea (CARMOL) 10 % cream Apply topically 2 (two) times daily. 02/15/19   Nathifa Ritthaler, Johnette Abraham B, FNP    Allergies Fish allergy, Milk-related compounds, Shellfish allergy, and Dust mite extract  No family history on file.  Social History Social History   Tobacco Use  . Smoking status: Never Smoker  . Smokeless tobacco: Never Used  Substance Use Topics  . Alcohol use: Yes  . Drug use: No    Review of Systems  Constitutional: Negative for fever. Respiratory: Negative for cough or  shortness of breath.  Musculoskeletal: Negative for myalgias Skin: Positive for generalized rash. Neurological: Negative for numbness or paresthesias. ____________________________________________   PHYSICAL EXAM:  VITAL SIGNS: ED Triage Vitals  Enc Vitals Group     BP 02/15/19 1629 (!) 152/84     Pulse Rate 02/15/19 1629 83     Resp 02/15/19 1629 18     Temp 02/15/19 1629 98.1 F (36.7 C)     Temp Source 02/15/19 1629 Oral     SpO2 02/15/19 1629 100 %     Weight 02/15/19 1608 188 lb 0.8 oz (85.3 kg)     Height 02/15/19 1608 5\' 8"  (1.727 m)     Head Circumference --      Peak Flow --      Pain Score 02/15/19 1607 0     Pain Loc --      Pain Edu? --      Excl. in [place]? --      Constitutional: Uncomfortable appearing. Eyes: Conjunctivae are clear without discharge or drainage. Nose: No rhinorrhea noted. Mouth/Throat: Airway is patent.  Neck: No stridor. Unrestricted range of motion observed. Cardiovascular: Capillary refill is <3 seconds.  Respiratory: Respirations are even and unlabored.. Musculoskeletal: Unrestricted range of motion observed. Neurologic: Awake, alert, and oriented x 4.  Skin: Severe atopic dermatitis over face, neck, trunk, and extremities.   ____________________________________________   LABS (all labs ordered are listed, but only abnormal results are displayed)  Labs  Reviewed - No data to display ____________________________________________  EKG  Not indicated. ____________________________________________  RADIOLOGY  Not indicated. ____________________________________________   PROCEDURES  Procedures ____________________________________________   INITIAL IMPRESSION / ASSESSMENT AND PLAN / ED COURSE  [name] is a 23 y.o. male presenting to the emergency department for treatment and evaluation of severe atopic dermatitis.  He is on the end of his prednisone taper but has not had any improvement.  He is also using Atarax and  hydrocortisone cream.  He requested that I speak with his mom.  His mom states that he has no insurance and cannot get into see dermatology.  She wonders what can be done here.  I have advised her that he will need to establish primary care and will be provided a list of community resources.  She was also advised to inquire about charity care with [facility] in order to get him in with dermatology.  Proper skin hydration was also discussed and he will be given a prescription for urea ointment.  An extended tapering dose of prednisone was written and he is to take 50 mg of Atarax instead of 25.   Medications - No data to display   Pertinent labs & imaging results that were available during my care of the patient were reviewed by me and considered in my medical decision making (see chart for details).  ____________________________________________   FINAL CLINICAL IMPRESSION(S) / ED DIAGNOSES  Final diagnoses:  Other eczema    ED Discharge Orders         Ordered    predniSONE (DELTASONE) 10 MG tablet  Daily     02/15/19 1743    hydrOXYzine (ATARAX/VISTARIL) 25 MG tablet  Every 6 hours PRN     02/15/19 1743    urea (CARMOL) 10 % cream  2 times daily     02/15/19 1743           Note:  This document was prepared using Dragon voice recognition software and may include unintentional dictation errors.   Chinita Pester, FNP 02/15/19 2105    Willy Eddy, MD 02/15/19 [phone]

## 2019-05-27 ENCOUNTER — Other Ambulatory Visit: Payer: Self-pay

## 2019-05-27 ENCOUNTER — Encounter: Payer: Self-pay | Admitting: Emergency Medicine

## 2019-05-27 ENCOUNTER — Emergency Department: Payer: No Typology Code available for payment source

## 2019-05-27 ENCOUNTER — Emergency Department
Admission: EM | Admit: 2019-05-27 | Discharge: 2019-05-27 | Disposition: A | Discharge status: Home / Self Care | Payer: No Typology Code available for payment source | Attending: Emergency Medicine | Admitting: Emergency Medicine

## 2019-05-27 DIAGNOSIS — Z79899 Other long term (current) drug therapy: Secondary | ICD-10-CM | POA: Insufficient documentation

## 2019-05-27 DIAGNOSIS — J4521 Mild intermittent asthma with (acute) exacerbation: Secondary | ICD-10-CM | POA: Insufficient documentation

## 2019-05-27 DIAGNOSIS — Z20822 Contact with and (suspected) exposure to covid-19: Secondary | ICD-10-CM | POA: Insufficient documentation

## 2019-05-27 MED ORDER — ALBUTEROL SULFATE (2.5 MG/3ML) 0.083% IN NEBU: 2.5000 mg | INHALATION_SOLUTION | Freq: Four times a day (QID) | RESPIRATORY_TRACT | 1 refills | Status: DC | PRN

## 2019-05-27 MED ORDER — ALBUTEROL SULFATE HFA 108 (90 BASE) MCG/ACT IN AERS: 2.0000 | INHALATION_SPRAY | Freq: Four times a day (QID) | RESPIRATORY_TRACT | 3 refills | Status: DC | PRN

## 2019-05-27 MED ORDER — ALBUTEROL SULFATE HFA 108 (90 BASE) MCG/ACT IN AERS
4.0000 | INHALATION_SPRAY | Freq: Once | RESPIRATORY_TRACT | Status: AC
  Administered 2019-05-27: 14:00:00 4 via RESPIRATORY_TRACT
  Filled 2019-05-27: qty 6.7

## 2019-05-27 MED ORDER — PREDNISONE 20 MG PO TABS
60.0000 mg | ORAL_TABLET | Freq: Once | ORAL | Status: AC
  Administered 2019-05-27: 14:00:00 60 mg via ORAL
  Filled 2019-05-27: qty 3

## 2019-05-27 MED ORDER — PREDNISONE 50 MG PO TABS: ORAL_TABLET | ORAL | 0 refills | Status: DC

## 2019-05-27 NOTE — ED Triage Notes (Signed)
Patient presents to the ED via [facility] for asthma exacerbation.  Patient states he has been out of his inhaler and became short of breath.  EMS gave patient 125mg  of solumedrol and a duoneb breathing treatment.  Patient reports some improvement with this treatment.

## 2019-05-27 NOTE — ED Notes (Signed)
See triage note  Presents with some SOB  States he thinks his asthma is acting up   States he uses inhalers only  Afebrile on arrival

## 2019-05-27 NOTE — Discharge Instructions (Signed)
Although I find it unlikely to be positive, we did send a coronavirus test for you.  Please remain out of work and self quarantine until the results of this have at least returned negative, if positive then will you you need we will need you to stay out of work and quarantine for a minimum of 10 additional days.

## 2019-05-27 NOTE — ED Provider Notes (Signed)
[facility] Emergency Department Provider Note   ____________________________________________   First MD Initiated Contact with Patient 05/27/19 1319     (approximate)  I have reviewed the triage vital signs and the nursing notes.   HISTORY  Chief Complaint Shortness of Breath    HPI Eric Mills is a 24 y.o. male for evaluation of shortness of breath  Patient reports he has a long history of asthma began having symptoms 2 days ago but is run out of his inhaler.  Usually uses a pump like albuterol and sometimes had to use prednisone, but has not had any.  Does not currently have a primary doctor  Denies fevers chills or productive cough.  Reports a slight feeling of shortness of breath seemed a worsening over the night.  Called EMS for assistance received 125 out of Solu-Medrol and some albuterol and he reports he started feel quite a bit better actually  No fever chills.  No loss of taste or smell.  No sinus symptoms.  Denies Covid exposure no chest pain     Past Medical History:  Diagnosis Date  . Asthma   . Attention deficit disorder (ADD)   . Eczema   . Seasonal allergies     There are no problems to display for this patient.   Past Surgical History:  Procedure Laterality Date  . ANKLE SURGERY      Prior to Admission medications   Medication Sig Start Date End Date Taking? Authorizing Provider  albuterol (PROVENTIL) (2.5 MG/3ML) 0.083% nebulizer solution Take 3 mLs (2.5 mg total) by nebulization every 6 (six) hours as needed for wheezing or shortness of breath. 05/27/19   Sharyn Creamer, MD  albuterol (VENTOLIN HFA) 108 (90 Base) MCG/ACT inhaler Inhale 2 puffs into the lungs every 6 (six) hours as needed for wheezing or shortness of breath. 05/27/19   Sharyn Creamer, MD  cetirizine (ZYRTEC) 10 MG tablet Take 1 tablet (10 mg total) by mouth daily. 12/22/18 01/21/19  Menshew, Charlesetta Ivory, PA-C  hydrOXYzine (ATARAX/VISTARIL) 25 MG tablet Take 2  tablets (50 mg total) by mouth every 6 (six) hours as needed. 02/15/19   Triplett, Rulon Eisenmenger B, FNP  predniSONE (DELTASONE) 50 MG tablet 1 tablet by mouth daily 05/27/19   Sharyn Creamer, MD  urea (CARMOL) 10 % cream Apply topically 2 (two) times daily. 02/15/19   Triplett, Rulon Eisenmenger B, FNP    Allergies Fish allergy, Milk-related compounds, Shellfish allergy, and Dust mite extract  No family history on file.  Social History Social History   Tobacco Use  . Smoking status: Never Smoker  . Smokeless tobacco: Never Used  Substance Use Topics  . Alcohol use: Yes  . Drug use: No    Review of Systems Constitutional: No fever/chills Eyes: No visual changes. ENT: No sore throat. Cardiovascular: Denies chest pain. Respiratory: See HPI, reports that it is a wheezing or tight feeling.  Improved now.  Gastrointestinal: No abdominal pain.   Genitourinary: Negative for dysuria. Musculoskeletal: Negative for back pain. Skin: Negative for rash. Neurological: Negative for headaches   Previously hospitalized for asthma as a child  ____________________________________________   PHYSICAL EXAM:  VITAL SIGNS: ED Triage Vitals  Enc Vitals Group     BP 05/27/19 1311 109/83     Pulse Rate 05/27/19 1311 92     Resp 05/27/19 1311 16     Temp 05/27/19 1311 98.5 F (36.9 C)     Temp Source 05/27/19 1311 Oral  SpO2 05/27/19 1311 98 %     Weight 05/27/19 1312 165 lb (74.8 kg)     Height 05/27/19 1312 5\' 6"  (1.676 m)     Head Circumference --      Peak Flow --      Pain Score --      Pain Loc --      Pain Edu? --      Excl. in [place]? --     Constitutional: Alert and oriented. Well appearing and in no acute distress.  Very pleasant.  Resting comfortably Eyes: Conjunctivae are normal. Head: Atraumatic. Nose: No congestion/rhinnorhea. Mouth/Throat: Mucous membranes are moist. Neck: No stridor.  Cardiovascular: Normal rate, regular rhythm. Grossly normal heart sounds.  Good peripheral circulation.  Respiratory: Normal respiratory effort.  No retractions. Lungs CTAB except for some mild expiratory wheezing in all fields but no distress.  Full sentences normal work of breathing. Gastrointestinal: Soft and nontender. No distention. Musculoskeletal: No lower extremity tenderness nor edema. Neurologic:  Normal speech and language. No gross focal neurologic deficits are appreciated.  Skin:  Skin is warm, dry and intact. No rash noted except for this appears to be eczematous or psoriatic type lesions, favoring eczema is appears dry cracked and appearing in multiple regions patient reports chronic. Psychiatric: Mood and affect are normal. Speech and behavior are normal.  ____________________________________________   LABS (all labs ordered are listed, but only abnormal results are displayed)  Labs Reviewed  NOVEL CORONAVIRUS, NAA (HOSP ORDER, SEND-OUT TO REF LAB; TAT 18-24 HRS)   ____________________________________________  EKG   ____________________________________________  RADIOLOGY  DG Chest 2 View  Result Date: 05/27/2019 CLINICAL DATA:  Shortness of breath EXAM: CHEST - 2 VIEW COMPARISON:  February 04, 2019 FINDINGS: The lungs are clear. The heart size and pulmonary vascularity are normal. No adenopathy. No pneumothorax. No bone lesions. IMPRESSION: No abnormality noted. Electronically Signed   By: Lowella Grip III M.D.   On: 05/27/2019 13:44    ____________________________________________   PROCEDURES  Procedure(s) performed: None  Procedures  Critical Care performed: No  ____________________________________________   INITIAL IMPRESSION / ASSESSMENT AND PLAN / ED COURSE  Pertinent labs & imaging results that were available during my care of the patient were reviewed by me and considered in my medical decision making (see chart for details).   Dyspnea.  Patient with a long history of asthma and reports same symptoms.  No acute infectious symptoms.  Chest x-ray  normal.  His work of breathing reassuring.  Patient agreeable to receiving send out Covid test though my pretest probability is low.  Provide albuterol, albuterol MDI, steroids  No acute cardiac vascular neurologic symptoms.  Reassuring pulmonary examination with only mild wheezing and normal work of breathing at this time      ____________________________________________   FINAL CLINICAL IMPRESSION(S) / ED DIAGNOSES  Final diagnoses:  Mild intermittent asthma with exacerbation        Note:  This document was prepared using Dragon voice recognition software and may include unintentional dictation errors       Delman Kitten, MD 05/27/19 1515

## 2019-05-28 LAB — NOVEL CORONAVIRUS, NAA (HOSP ORDER, SEND-OUT TO REF LAB; TAT 18-24 HRS): SARS-CoV-2, NAA: NOT DETECTED

## 2019-08-06 ENCOUNTER — Other Ambulatory Visit: Payer: Self-pay

## 2019-08-06 ENCOUNTER — Emergency Department
Admission: EM | Admit: 2019-08-06 | Discharge: 2019-08-06 | Disposition: A | Discharge status: Home / Self Care | Payer: Self-pay | Attending: Emergency Medicine | Admitting: Emergency Medicine

## 2019-08-06 DIAGNOSIS — Z79899 Other long term (current) drug therapy: Secondary | ICD-10-CM | POA: Insufficient documentation

## 2019-08-06 DIAGNOSIS — T7840XA Allergy, unspecified, initial encounter: Secondary | ICD-10-CM | POA: Insufficient documentation

## 2019-08-06 DIAGNOSIS — T782XXA Anaphylactic shock, unspecified, initial encounter: Secondary | ICD-10-CM | POA: Insufficient documentation

## 2019-08-06 MED ORDER — METHYLPREDNISOLONE SODIUM SUCC 125 MG IJ SOLR
125.0000 mg | Freq: Once | INTRAMUSCULAR | Status: AC
  Administered 2019-08-06: 125 mg via INTRAVENOUS
  Filled 2019-08-06: qty 2

## 2019-08-06 MED ORDER — DIPHENHYDRAMINE HCL 50 MG/ML IJ SOLN
50.0000 mg | Freq: Once | INTRAMUSCULAR | Status: AC
  Administered 2019-08-06: 50 mg via INTRAVENOUS
  Filled 2019-08-06: qty 1

## 2019-08-06 MED ORDER — EPINEPHRINE 0.3 MG/0.3ML IJ SOAJ: 0.3000 mg | INTRAMUSCULAR | 1 refills | Status: DC | PRN

## 2019-08-06 MED ORDER — EPINEPHRINE 0.3 MG/0.3ML IJ SOAJ
0.3000 mg | Freq: Once | INTRAMUSCULAR | Status: AC
  Administered 2019-08-06: 0.3 mg via INTRAMUSCULAR

## 2019-08-06 MED ORDER — PREDNISONE 20 MG PO TABS: 60.0000 mg | ORAL_TABLET | Freq: Every day | ORAL | 0 refills | Status: DC

## 2019-08-06 MED ORDER — FAMOTIDINE IN NACL 20-0.9 MG/50ML-% IV SOLN
20.0000 mg | Freq: Once | INTRAVENOUS | Status: AC
  Administered 2019-08-06: 20 mg via INTRAVENOUS
  Filled 2019-08-06: qty 50

## 2019-08-06 MED ORDER — PREDNISONE 20 MG PO TABS: 60.0000 mg | ORAL_TABLET | Freq: Every day | ORAL | 0 refills | Status: AC

## 2019-08-06 NOTE — ED Triage Notes (Signed)
PT to ed from home with allergic reaction. PT haing oral swelling, reports diff breathing and swallowing. PT has allergy to dairy, was eating lasagna. PT given EPI pen with verbal orders from Manson Passey, MD at 130. PT not drooling and speaking in complete sentences at this time.

## 2019-08-06 NOTE — ED Provider Notes (Signed)
[facility] Emergency Department Provider Note  ____________________________________________   First MD Initiated Contact with Patient 08/06/19 0132     (approximate)  I have reviewed the triage vital signs and the nursing notes.   HISTORY  Chief Complaint Allergic Reaction    HPI Eric Mills is a 24 y.o. male with below list of previous medical conditions including dairy allergy presents to the emergency department secondary to "allergic reaction".  Patient states after eating lasagna tonight he states started to experience oral swelling with difficulty breathing and swallowing which prompted his visit to the emergency department immediately.  Patient also admits to pruritus.  Patient states that he has had an EpiPen prescribed to him in the past but does not have it anymore.       Past Medical History:  Diagnosis Date  . Asthma   . Attention deficit disorder (ADD)   . Eczema   . Seasonal allergies     There are no problems to display for this patient.   Past Surgical History:  Procedure Laterality Date  . ANKLE SURGERY      Prior to Admission medications   Medication Sig Start Date End Date Taking? Authorizing Provider  albuterol (PROVENTIL) (2.5 MG/3ML) 0.083% nebulizer solution Take 3 mLs (2.5 mg total) by nebulization every 6 (six) hours as needed for wheezing or shortness of breath. 05/27/19  Yes Sharyn Creamer, MD  albuterol (VENTOLIN HFA) 108 (90 Base) MCG/ACT inhaler Inhale 2 puffs into the lungs every 6 (six) hours as needed for wheezing or shortness of breath. 05/27/19  Yes Sharyn Creamer, MD  cetirizine (ZYRTEC) 10 MG tablet Take 1 tablet (10 mg total) by mouth daily. 12/22/18 01/21/19  Menshew, Charlesetta Ivory, PA-C  hydrOXYzine (ATARAX/VISTARIL) 25 MG tablet Take 2 tablets (50 mg total) by mouth every 6 (six) hours as needed. Patient not taking: Reported on 08/06/2019 02/15/19   Kem Boroughs B, FNP  predniSONE (DELTASONE) 50 MG tablet 1  tablet by mouth daily Patient not taking: Reported on 08/06/2019 05/27/19   Sharyn Creamer, MD  urea (CARMOL) 10 % cream Apply topically 2 (two) times daily. Patient not taking: Reported on 08/06/2019 02/15/19   Kem Boroughs B, FNP    Allergies Fish allergy, Milk-related compounds, Shellfish allergy, and Dust mite extract  History reviewed. No pertinent family history.  Social History Social History   Tobacco Use  . Smoking status: Never Smoker  . Smokeless tobacco: Never Used  Substance Use Topics  . Alcohol use: Yes  . Drug use: No    Review of Systems Constitutional: No fever/chills Eyes: No visual changes. ENT: No sore throat.  Positive for oral swelling Cardiovascular: Denies chest pain. Respiratory: Denies shortness of breath. Gastrointestinal: No abdominal pain.  No nausea, no vomiting.  No diarrhea.  No constipation. Genitourinary: Negative for dysuria. Musculoskeletal: Negative for neck pain.  Negative for back pain. Integumentary: Positive for rash. Neurological: Negative for headaches, focal weakness or numbness.   ____________________________________________   PHYSICAL EXAM:  VITAL SIGNS: ED Triage Vitals  Enc Vitals Group     BP 08/06/19 0130 126/80     Pulse Rate 08/06/19 0130 64     Resp 08/06/19 0135 13     Temp 08/06/19 0135 98.4 F (36.9 C)     Temp Source 08/06/19 0135 Oral     SpO2 08/06/19 0130 99 %     Weight 08/06/19 0136 90.7 kg (200 lb)     Height 08/06/19 0136 1.778 m (5'  10")     Head Circumference --      Peak Flow --      Pain Score 08/06/19 0136 0     Pain Loc --      Pain Edu? --      Excl. in [place]? --     Constitutional: Alert and oriented.  Eyes: Conjunctivae are normal.  Mouth/Throat: Patient is wearing a mask. Neck: No stridor.  No meningeal signs.   Cardiovascular: Normal rate, regular rhythm. Good peripheral circulation. Grossly normal heart sounds. Respiratory: Normal respiratory effort.  No retractions. Gastrointestinal:  Soft and nontender. No distention.  Musculoskeletal: No lower extremity tenderness nor edema. No gross deformities of extremities. Neurologic:  Normal speech and language. No gross focal neurologic deficits are appreciated.  Skin: Diffuse eczematous rash no hives noted Psychiatric: Mood and affect are normal. Speech and behavior are normal.    .Critical Care Performed by: Darci Current, MD Authorized by: Darci Current, MD   Critical care provider statement:    Critical care time (minutes):  30   Critical care time was exclusive of:  Separately billable procedures and treating other patients (Anaphylaxis)   Critical care was time spent personally by me on the following activities:  Development of treatment plan with patient or surrogate, discussions with consultants, evaluation of patient's response to treatment, examination of patient, obtaining history from patient or surrogate, ordering and performing treatments and interventions, ordering and review of laboratory studies, ordering and review of radiographic studies, pulse oximetry, re-evaluation of patient's condition and review of old charts     ____________________________________________   INITIAL IMPRESSION / MDM / ASSESSMENT AND PLAN / ED COURSE  As part of my medical decision making, I reviewed the following data within the electronic MEDICAL RECORD NUMBER   24 year old male presented with above-stated history and physical exam concerning for anaphylaxis and a such patient given 0.3 mg IM epinephrine.  Patient subsequently given IV Solu-Medrol 125 mg Benadryl 50 mg and Pepcid 20 mg with complete resolution of symptoms.  Patient observed in the emergency department for 4 hours with no return of symptoms.  Patient be prescribed prednisone and EpiPen for home.  ____________________________________________  FINAL CLINICAL IMPRESSION(S) / ED DIAGNOSES  Final diagnoses:  Allergic reaction, initial encounter  Anaphylaxis,  initial encounter     MEDICATIONS GIVEN DURING THIS VISIT:  Medications  EPINEPHrine (EPI-PEN) injection 0.3 mg (0.3 mg Intramuscular Given 08/06/19 0130)  methylPREDNISolone sodium succinate (SOLU-MEDROL) 125 mg/2 mL injection 125 mg (125 mg Intravenous Given 08/06/19 0140)  diphenhydrAMINE (BENADRYL) injection 50 mg (50 mg Intravenous Given 08/06/19 0141)  famotidine (PEPCID) IVPB 20 mg premix (0 mg Intravenous Stopped 08/06/19 0226)     ED Discharge Orders    None      *Please note:  Izeyah Berrios was evaluated in Emergency Department on 08/06/2019 for the symptoms described in the history of present illness. He was evaluated in the context of the global COVID-19 pandemic, which necessitated consideration that the patient might be at risk for infection with the SARS-CoV-2 virus that causes COVID-19. Institutional protocols and algorithms that pertain to the evaluation of patients at risk for COVID-19 are in a state of rapid change based on information released by regulatory bodies including the CDC and federal and state organizations. These policies and algorithms were followed during the patient's care in the ED.  Some ED evaluations and interventions may be delayed as a result of limited staffing during the pandemic.*  Note:  This  document was prepared using Systems analyst and may include unintentional dictation errors.   Gregor Hams, MD 08/06/19 [phone]

## 2019-09-10 ENCOUNTER — Encounter: Payer: Self-pay | Admitting: Emergency Medicine

## 2019-09-10 ENCOUNTER — Emergency Department
Admission: EM | Admit: 2019-09-10 | Discharge: 2019-09-10 | Disposition: A | Discharge status: Home / Self Care | Payer: Self-pay | Attending: Emergency Medicine | Admitting: Emergency Medicine

## 2019-09-10 ENCOUNTER — Other Ambulatory Visit: Payer: Self-pay

## 2019-09-10 DIAGNOSIS — T782XXA Anaphylactic shock, unspecified, initial encounter: Secondary | ICD-10-CM

## 2019-09-10 DIAGNOSIS — T7840XA Allergy, unspecified, initial encounter: Secondary | ICD-10-CM | POA: Insufficient documentation

## 2019-09-10 DIAGNOSIS — Z79899 Other long term (current) drug therapy: Secondary | ICD-10-CM | POA: Insufficient documentation

## 2019-09-10 DIAGNOSIS — J45909 Unspecified asthma, uncomplicated: Secondary | ICD-10-CM | POA: Insufficient documentation

## 2019-09-10 MED ORDER — PREDNISONE 20 MG PO TABS: 40.0000 mg | ORAL_TABLET | Freq: Every day | ORAL | 0 refills | Status: DC

## 2019-09-10 MED ORDER — METHYLPREDNISOLONE SODIUM SUCC 125 MG IJ SOLR
125.0000 mg | Freq: Once | INTRAMUSCULAR | Status: AC
  Administered 2019-09-10: 125 mg via INTRAVENOUS
  Filled 2019-09-10: qty 2

## 2019-09-10 MED ORDER — EPINEPHRINE 0.3 MG/0.3ML IJ SOAJ: 0.3000 mg | INTRAMUSCULAR | 1 refills | Status: AC | PRN

## 2019-09-10 MED ORDER — PREDNISONE 20 MG PO TABS: 40.0000 mg | ORAL_TABLET | Freq: Every day | ORAL | 0 refills | Status: AC

## 2019-09-10 MED ORDER — EPINEPHRINE 0.3 MG/0.3ML IJ SOAJ: 0.3000 mg | INTRAMUSCULAR | 1 refills | Status: DC | PRN

## 2019-09-10 NOTE — ED Provider Notes (Signed)
[facility] Emergency Department Provider Note  ____________________________________________   First MD Initiated Contact with Patient 09/10/19 0018     (approximate)  I have reviewed the triage vital signs and the nursing notes.   HISTORY  Chief Complaint Allergic Reaction    HPI Eric Mills is a 24 y.o. male with history of allergic reaction who comes in for allergic reaction to cheese.  Patient states that he does not always react to the cheese but he was eating pizza today when he had swelling of his lips and feeling like his throat was closing up.  This has been constant since onset just a few minutes ago, better with the medicines given by EMS, worse by the intake of cheese, severe.  Denies any shortness of breath, abdominal pain, rash.  Currently his symptoms are much better.  Patient received IM epi, Pepcid, Benadryl.          Past Medical History:  Diagnosis Date  . Asthma   . Attention deficit disorder (ADD)   . Eczema   . Seasonal allergies     There are no problems to display for this patient.   Past Surgical History:  Procedure Laterality Date  . ANKLE SURGERY      Prior to Admission medications   Medication Sig Start Date End Date Taking? Authorizing Provider  albuterol (PROVENTIL) (2.5 MG/3ML) 0.083% nebulizer solution Take 3 mLs (2.5 mg total) by nebulization every 6 (six) hours as needed for wheezing or shortness of breath. 05/27/19   Sharyn Creamer, MD  albuterol (VENTOLIN HFA) 108 (90 Base) MCG/ACT inhaler Inhale 2 puffs into the lungs every 6 (six) hours as needed for wheezing or shortness of breath. 05/27/19   Sharyn Creamer, MD  cetirizine (ZYRTEC) 10 MG tablet Take 1 tablet (10 mg total) by mouth daily. 12/22/18 01/21/19  Menshew, Charlesetta Ivory, PA-C  EPINEPHrine (EPIPEN 2-PAK) 0.3 mg/0.3 mL IJ SOAJ injection Inject 0.3 mLs (0.3 mg total) into the muscle as needed for anaphylaxis. 08/06/19   Darci Current, MD  hydrOXYzine  (ATARAX/VISTARIL) 25 MG tablet Take 2 tablets (50 mg total) by mouth every 6 (six) hours as needed. Patient not taking: Reported on 08/06/2019 02/15/19   Kem Boroughs B, FNP  predniSONE (DELTASONE) 50 MG tablet 1 tablet by mouth daily Patient not taking: Reported on 08/06/2019 05/27/19   Sharyn Creamer, MD  urea (CARMOL) 10 % cream Apply topically 2 (two) times daily. Patient not taking: Reported on 08/06/2019 02/15/19   Kem Boroughs B, FNP    Allergies Fish allergy, Milk-related compounds, Shellfish allergy, and Dust mite extract  No family history on file.  Social History Social History   Tobacco Use  . Smoking status: Never Smoker  . Smokeless tobacco: Never Used  Substance Use Topics  . Alcohol use: Yes  . Drug use: No      Review of Systems Constitutional: No fever/chills Eyes: No visual changes. ENT: Throat and lip swelling Cardiovascular: Denies chest pain. Respiratory: Denies shortness of breath. Gastrointestinal: No abdominal pain.  No nausea, no vomiting.  No diarrhea.  No constipation. Genitourinary: Negative for dysuria. Musculoskeletal: Negative for back pain. Skin: Negative for rash. Neurological: Negative for headaches, focal weakness or numbness. All other ROS negative ____________________________________________   PHYSICAL EXAM:  VITAL SIGNS: ED Triage Vitals [09/10/19 0025]  Enc Vitals Group     BP (!) 174/76     Pulse Rate 65     Resp 14     Temp  97.6 F (36.4 C)     Temp Source Oral     SpO2 99 %     Weight      Height      Head Circumference      Peak Flow      Pain Score 0     Pain Loc      Pain Edu?      Excl. in [place]?     Constitutional: Alert and oriented. Well appearing and in no acute distress. Eyes: Conjunctivae are normal. EOMI. Head: Atraumatic. Nose: No congestion/rhinnorhea. Mouth/Throat: Mucous membranes are moist.  No edema noted Neck: No stridor. Trachea Midline. FROM Cardiovascular: Normal rate, regular rhythm. Grossly  normal heart sounds.  Good peripheral circulation. Respiratory: Normal respiratory effort.  No retractions. Lungs CTAB. Gastrointestinal: Soft and nontender. No distention. No abdominal bruits.  Musculoskeletal: No lower extremity tenderness nor edema.  No joint effusions. Neurologic:  Normal speech and language. No gross focal neurologic deficits are appreciated.  Skin:  Skin is warm, dry and intact. No rash noted. Psychiatric: Mood and affect are normal. Speech and behavior are normal. GU: Deferred   ____________________________________________   LABS (all labs ordered are listed, but only abnormal results are displayed)  Labs Reviewed - No data to display ____________________________________________   ED ECG REPORT I, Vanessa [place], the attending physician, personally viewed and interpreted this ECG.  EKG is sinus rate of 60, some ST elevation in 1 to aVL V2 and V3 is more consistent with early repolarization, T wave inversion lead III _____________ EKG looks similar to his prior EKGs _______________________________  INITIAL IMPRESSION / ASSESSMENT AND PLAN / ED COURSE  [name] was evaluated in Emergency Department on 09/10/2019 for the symptoms described in the history of present illness. He was evaluated in the context of the global COVID-19 pandemic, which necessitated consideration that the patient might be at risk for infection with the SARS-CoV-2 virus that causes COVID-19. Institutional protocols and algorithms that pertain to the evaluation of patients at risk for COVID-19 are in a state of rapid change based on information released by regulatory bodies including the CDC and federal and state organizations. These policies and algorithms were followed during the patient's care in the ED.     Patient is a 24 year old who comes in with anaphylaxis secondary to cheese.  Patient already received epi.  Will monitor patient for rebound reaction.  At this time do not see any  significant angioedema or any rash.  His airway appears clear.  No signs of cellulitis, abscess, other infection.  4:52 AM Reevaluated patient after being in the ER 4-1/2 hours continues to not have any symptoms.  Will send patient with a new EpiPen and some steroids and can follow-up as needed with his PCP  I discussed the provisional nature of ED diagnosis, the treatment so far, the ongoing plan of care, follow up appointments and return precautions with the patient and any family or support people present. They expressed understanding and agreed with the plan, discharged home.          ____________________________________________   FINAL CLINICAL IMPRESSION(S) / ED DIAGNOSES   Final diagnoses:  Anaphylaxis, initial encounter      MEDICATIONS GIVEN DURING THIS VISIT:  Medications  methylPREDNISolone sodium succinate (SOLU-MEDROL) 125 mg/2 mL injection 125 mg (125 mg Intravenous Given 09/10/19 0035)     ED Discharge Orders         Ordered    EPINEPHrine 0.3 mg/0.3 mL IJ  SOAJ injection  As needed     09/10/19 0453    predniSONE (DELTASONE) 20 MG tablet  Daily     09/10/19 0453           Note:  This document was prepared using Dragon voice recognition software and may include unintentional dictation errors.   Concha Se, MD 09/10/19 [phone]

## 2019-09-10 NOTE — ED Notes (Signed)
Pt continues to rest in bed, lights down, respirations even and unlabored, denies SOB, itching or or swelling.  Will continue to monitor and round on patient.

## 2019-09-10 NOTE — Discharge Instructions (Addendum)
Take the course of steroids to prevent rebound reaction and see the EpiPen in case you have another future reaction.

## 2019-09-10 NOTE — ED Triage Notes (Signed)
Pt to ED via EMS from home c/o allergic reaction tonight.  States ate pizza tonight around midnight and started feeling itching throat and then tongue swelling, trouble breathing.  EMS reports wheezing and stridor heard in lungs, angioedema, tongue swelling.  EMS administered Epi IM right thigh, 1 duoneb, 50mg  benadryl, 20mg  famotodine.  After medications pt lung sounds clear, no swelling and itching resolved.  Pt presents A&Ox4, in NAD, states feels relief.  EMS vitals 140/82 BP, 136 CBG, 99% RA, 65 HR.

## 2019-11-10 ENCOUNTER — Telehealth: Payer: Self-pay | Admitting: General Practice

## 2019-11-10 NOTE — Telephone Encounter (Signed)
Individual has been contacted regarding ED referral and has an invalid phone number. No further attempts will be made to contact individual.

## 2019-12-22 ENCOUNTER — Emergency Department
Admission: EM | Admit: 2019-12-22 | Discharge: 2019-12-22 | Disposition: A | Discharge status: Home / Self Care | Payer: Self-pay | Attending: Emergency Medicine | Admitting: Emergency Medicine

## 2019-12-22 DIAGNOSIS — T7840XA Allergy, unspecified, initial encounter: Secondary | ICD-10-CM | POA: Insufficient documentation

## 2019-12-22 DIAGNOSIS — Z79899 Other long term (current) drug therapy: Secondary | ICD-10-CM | POA: Insufficient documentation

## 2019-12-22 DIAGNOSIS — T782XXA Anaphylactic shock, unspecified, initial encounter: Secondary | ICD-10-CM | POA: Insufficient documentation

## 2019-12-22 DIAGNOSIS — J45909 Unspecified asthma, uncomplicated: Secondary | ICD-10-CM | POA: Insufficient documentation

## 2019-12-22 MED ORDER — FAMOTIDINE 20 MG PO TABS: 20.0000 mg | ORAL_TABLET | Freq: Two times a day (BID) | ORAL | 0 refills | Status: AC

## 2019-12-22 MED ORDER — METHYLPREDNISOLONE SODIUM SUCC 125 MG IJ SOLR
125.0000 mg | Freq: Once | INTRAMUSCULAR | Status: AC
  Administered 2019-12-22: 125 mg via INTRAVENOUS
  Filled 2019-12-22: qty 2

## 2019-12-22 MED ORDER — ONDANSETRON HCL 4 MG/2ML IJ SOLN
INTRAMUSCULAR | Status: AC
  Filled 2019-12-22: qty 2

## 2019-12-22 MED ORDER — FAMOTIDINE IN NACL 20-0.9 MG/50ML-% IV SOLN
20.0000 mg | Freq: Once | INTRAVENOUS | Status: AC
  Administered 2019-12-22: 20 mg via INTRAVENOUS
  Filled 2019-12-22 (×3): qty 50

## 2019-12-22 MED ORDER — PREDNISONE 20 MG PO TABS: ORAL_TABLET | ORAL | 0 refills | Status: DC

## 2019-12-22 MED ORDER — ONDANSETRON HCL 4 MG/2ML IJ SOLN
4.0000 mg | Freq: Once | INTRAMUSCULAR | Status: AC
  Administered 2019-12-22: 4 mg via INTRAVENOUS

## 2019-12-22 MED ORDER — EPINEPHRINE 0.3 MG/0.3ML IJ SOAJ: 0.3000 mg | Freq: Once | INTRAMUSCULAR | 1 refills | Status: AC

## 2019-12-22 MED ORDER — EPINEPHRINE 0.3 MG/0.3ML IJ SOAJ
0.3000 mg | Freq: Once | INTRAMUSCULAR | Status: AC
  Administered 2019-12-22: 0.3 mg via INTRAMUSCULAR
  Filled 2019-12-22: qty 0.3

## 2019-12-22 MED ORDER — SODIUM CHLORIDE 0.9 % IV BOLUS
1000.0000 mL | Freq: Once | INTRAVENOUS | Status: AC
  Administered 2019-12-22: 1000 mL via INTRAVENOUS

## 2019-12-22 NOTE — Discharge Instructions (Addendum)
1. Take the following medicines for the next 4 days: Prednisone 60mg daily Pepcid0mg twice daily 2. Take Benadryl as needed for itching. 3. Use Epi-Pen in case of acute, life-threatening allergic reaction. 4. Return to the ER for worsening symptoms, persistent vomiting, difficulty breathing or other concerns.  

## 2019-12-22 NOTE — ED Provider Notes (Signed)
[facility] Emergency Department Provider Note   ____________________________________________   First MD Initiated Contact with Patient 12/22/19 [phone]     (approximate)  I have reviewed the triage vital signs and the nursing notes.   HISTORY  Chief Complaint Allergic reaction   HPI Eric Mills is a 24 y.o. male brought to the ED via EMS from home with a chief complaint of allergic reaction.  Patient has a shellfish allergy and states his girlfriend was eating earlier.  Believes may have gotten in contact with his bedding and he awoke with sensation of throat swelling and wheezing.  He was given a DuoNeb and 50 mg IM Benadryl by EMS prior to arrival.  Reports relief after DuoNeb.  Denies chest pain, abdominal pain, nausea, vomiting, diarrhea or rash.      Past Medical History:  Diagnosis Date  . Asthma   . Attention deficit disorder (ADD)   . Eczema   . Seasonal allergies     There are no problems to display for this patient.   Past Surgical History:  Procedure Laterality Date  . ANKLE SURGERY      Prior to Admission medications   Medication Sig Start Date End Date Taking? Authorizing Provider  albuterol (PROVENTIL) (2.5 MG/3ML) 0.083% nebulizer solution Take 3 mLs (2.5 mg total) by nebulization every 6 (six) hours as needed for wheezing or shortness of breath. 05/27/19   Sharyn Creamer, MD  albuterol (VENTOLIN HFA) 108 (90 Base) MCG/ACT inhaler Inhale 2 puffs into the lungs every 6 (six) hours as needed for wheezing or shortness of breath. 05/27/19   Sharyn Creamer, MD  cetirizine (ZYRTEC) 10 MG tablet Take 1 tablet (10 mg total) by mouth daily. 12/22/18 01/21/19  Menshew, Charlesetta Ivory, PA-C  EPINEPHrine 0.3 mg/0.3 mL IJ SOAJ injection Inject 0.3 mLs (0.3 mg total) into the muscle once for 1 dose. 12/22/19 12/22/19  Irean Hong, MD  famotidine (PEPCID) 20 MG tablet Take 1 tablet (20 mg total) by mouth 2 (two) times daily. 12/22/19   Irean Hong, MD    hydrOXYzine (ATARAX/VISTARIL) 25 MG tablet Take 2 tablets (50 mg total) by mouth every 6 (six) hours as needed. Patient not taking: Reported on 08/06/2019 02/15/19   Kem Boroughs B, FNP  predniSONE (DELTASONE) 20 MG tablet 3 tablets daily x 4 days 12/22/19   Irean Hong, MD  urea (CARMOL) 10 % cream Apply topically 2 (two) times daily. Patient not taking: Reported on 08/06/2019 02/15/19   Kem Boroughs B, FNP    Allergies Fish allergy, Milk-related compounds, Shellfish allergy, and Dust mite extract  No family history on file.  Social History Social History   Tobacco Use  . Smoking status: Never Smoker  . Smokeless tobacco: Never Used  Vaping Use  . Vaping Use: Never used  Substance Use Topics  . Alcohol use: Yes    Comment: occ.  . Drug use: No    Review of Systems  Constitutional: No fever/chills Eyes: No visual changes. ENT: Positive for sensation of throat constriction.  No sore throat. Cardiovascular: Denies chest pain. Respiratory: Positive for wheezing. Gastrointestinal: No abdominal pain.  No nausea, no vomiting.  No diarrhea.  No constipation. Genitourinary: Negative for dysuria. Musculoskeletal: Negative for back pain. Skin: Negative for rash. Neurological: Negative for headaches, focal weakness or numbness.   ____________________________________________   PHYSICAL EXAM:  VITAL SIGNS: ED Triage Vitals  Enc Vitals Group     BP  Pulse      Resp      Temp      Temp src      SpO2      Weight      Height      Head Circumference      Peak Flow      Pain Score      Pain Loc      Pain Edu?      Excl. in [place]?     Constitutional: Alert and oriented. Well appearing and in mild acute distress. Eyes: Conjunctivae are normal. PERRL. EOMI. Head: Atraumatic. Nose: No congestion/rhinnorhea. Mouth/Throat: Mucous membranes are moist.  No tongue or lip angioedema.  No edema to posterior oropharynx.  There is no hoarse or muffled voice.  There is no  drooling.  Patient is tolerating secretions well.   Neck: No stridor.  No neck swelling. Cardiovascular: Normal rate, regular rhythm. Grossly normal heart sounds.  Good peripheral circulation. Respiratory: Normal respiratory effort.  No retractions. Lungs CTAB. Gastrointestinal: Soft and nontender to light or deep palpation. No distention. No abdominal bruits. No CVA tenderness. Musculoskeletal: No lower extremity tenderness nor edema.  No joint effusions. Neurologic:  Normal speech and language. No gross focal neurologic deficits are appreciated.  Skin:  Skin is warm, dry and intact. No rash noted.  No urticaria. Psychiatric: Mood and affect are normal. Speech and behavior are normal.  ____________________________________________   LABS (all labs ordered are listed, but only abnormal results are displayed)  Labs Reviewed - No data to display ____________________________________________  EKG  None ____________________________________________  RADIOLOGY  ED MD interpretation: None  Official radiology report(s): No results found.  ____________________________________________   PROCEDURES  Procedure(s) performed (including Critical Care):  .1-3 Lead EKG Interpretation Performed by: Irean Hong, MD Authorized by: Irean Hong, MD     Interpretation: normal     ECG rate:  60   ECG rate assessment: normal     Rhythm: sinus rhythm     Ectopy: none     Conduction: normal   Comments:     Patient placed on cardiac monitor to evaluate for arrhythmias     ____________________________________________   INITIAL IMPRESSION / ASSESSMENT AND PLAN / ED COURSE  As part of my medical decision making, I reviewed the following data within the electronic MEDICAL RECORD NUMBER Nursing notes reviewed and incorporated, Old chart reviewed and Notes from prior ED visits     Eric Mills was evaluated in Emergency Department on 12/22/2019 for the symptoms described in the history of  present illness. He was evaluated in the context of the global COVID-19 pandemic, which necessitated consideration that the patient might be at risk for infection with the SARS-CoV-2 virus that causes COVID-19. Institutional protocols and algorithms that pertain to the evaluation of patients at risk for COVID-19 are in a state of rapid change based on information released by regulatory bodies including the CDC and federal and state organizations. These policies and algorithms were followed during the patient's care in the ED.    24 year old male with shellfish allergy presenting with acute allergic reaction and wheezing.  No wheezing appreciated on exam after patient received DuoNeb by EMS.  Will administer IM epi, IV Solu-Medrol, Pepcid, fluids.  Will monitor in the emergency department for a minimum of 3 hours.   Clinical Course as of Dec 21 708  Wed Dec 22, 2019  0947 Care transferred to Dr. Mont Dutton at change of shift pending observation. If stable,  anticipate discharge around 9:30am.   [JS]    Clinical Course User Index [JS] Irean Hong, MD     ____________________________________________   FINAL CLINICAL IMPRESSION(S) / ED DIAGNOSES  Final diagnoses:  Allergic reaction, initial encounter  Anaphylaxis, initial encounter     ED Discharge Orders         Ordered    predniSONE (DELTASONE) 20 MG tablet     Discontinue  Reprint     12/22/19 0623    famotidine (PEPCID) 20 MG tablet  2 times daily     Discontinue  Reprint     12/22/19 0623    EPINEPHrine 0.3 mg/0.3 mL IJ SOAJ injection   Once     Discontinue  Reprint     12/22/19 1219           Note:  This document was prepared using Dragon voice recognition software and may include unintentional dictation errors.   Irean Hong, MD 12/22/19 [phone]

## 2019-12-22 NOTE — ED Notes (Signed)
Pt resting in NAD. Wakes easily. Pt given meal tray and juice. Pt given warm blanket. Denies further needs.

## 2019-12-22 NOTE — ED Notes (Signed)
Pt resting in NAD. Meds finished. VSS.

## 2019-12-22 NOTE — ED Provider Notes (Signed)
Patient reports she feels well nail.  No wheezing or rash or itching or shortness of breath or any other complaints.  Plan on letting him go very shortly.   Arnaldo Natal, MD 12/22/19 [phone]

## 2019-12-22 NOTE — ED Triage Notes (Signed)
Pt brought in from home by EMS, states is co shob, sore throat, and wheezing. Pt has a shellfish allergy and family was eating shellfish in room EMS gave 50 mg IM benadryl, duoneb x1. Pt did not take any meds prior to coming in, ran out of inhaler.

## 2020-04-21 ENCOUNTER — Emergency Department
Admission: EM | Admit: 2020-04-21 | Discharge: 2020-04-21 | Disposition: A | Discharge status: Home / Self Care | Payer: Self-pay | Attending: Emergency Medicine | Admitting: Emergency Medicine

## 2020-04-21 ENCOUNTER — Other Ambulatory Visit: Payer: Self-pay

## 2020-04-21 ENCOUNTER — Encounter: Payer: Self-pay | Admitting: Emergency Medicine

## 2020-04-21 DIAGNOSIS — Z5321 Procedure and treatment not carried out due to patient leaving prior to being seen by health care provider: Secondary | ICD-10-CM | POA: Insufficient documentation

## 2020-04-21 DIAGNOSIS — J45909 Unspecified asthma, uncomplicated: Secondary | ICD-10-CM | POA: Insufficient documentation

## 2020-04-21 DIAGNOSIS — R059 Cough, unspecified: Secondary | ICD-10-CM | POA: Insufficient documentation

## 2020-04-21 NOTE — ED Triage Notes (Signed)
Pt to ED via POV, states hx of asthma and has been out of his inhaler x several days. Pt states pain with coughing. Pt states needs prescription for inhaler.

## 2020-04-23 ENCOUNTER — Emergency Department
Admission: EM | Admit: 2020-04-23 | Discharge: 2020-04-23 | Disposition: A | Discharge status: Home / Self Care | Payer: Self-pay | Attending: Emergency Medicine | Admitting: Emergency Medicine

## 2020-04-23 ENCOUNTER — Other Ambulatory Visit: Payer: Self-pay

## 2020-04-23 ENCOUNTER — Encounter: Payer: Self-pay | Admitting: Emergency Medicine

## 2020-04-23 DIAGNOSIS — Z79899 Other long term (current) drug therapy: Secondary | ICD-10-CM | POA: Insufficient documentation

## 2020-04-23 DIAGNOSIS — J45901 Unspecified asthma with (acute) exacerbation: Secondary | ICD-10-CM | POA: Insufficient documentation

## 2020-04-23 MED ORDER — PREDNISONE 20 MG PO TABS
60.0000 mg | ORAL_TABLET | Freq: Once | ORAL | Status: AC
  Administered 2020-04-23: 60 mg via ORAL
  Filled 2020-04-23: qty 3

## 2020-04-23 MED ORDER — IPRATROPIUM-ALBUTEROL 0.5-2.5 (3) MG/3ML IN SOLN
3.0000 mL | Freq: Once | RESPIRATORY_TRACT | Status: AC
  Administered 2020-04-23: 3 mL via RESPIRATORY_TRACT
  Filled 2020-04-23: qty 3

## 2020-04-23 MED ORDER — ALBUTEROL SULFATE (2.5 MG/3ML) 0.083% IN NEBU
2.5000 mg | INHALATION_SOLUTION | Freq: Once | RESPIRATORY_TRACT | Status: DC
  Filled 2020-04-23: qty 3

## 2020-04-23 MED ORDER — ALBUTEROL SULFATE HFA 108 (90 BASE) MCG/ACT IN AERS: 2.0000 | INHALATION_SPRAY | Freq: Four times a day (QID) | RESPIRATORY_TRACT | 2 refills | Status: DC | PRN

## 2020-04-23 MED ORDER — PREDNISONE 20 MG PO TABS: ORAL_TABLET | ORAL | 0 refills | Status: DC

## 2020-04-23 NOTE — ED Notes (Signed)
Pt verbalizes understanding of d/c instructions, medications and follow up 

## 2020-04-23 NOTE — ED Triage Notes (Signed)
Pt reports has asthma. Pt states that he does not have an inhaler. Pt states he does not have an inhaler because he does not have a doctor.

## 2020-04-23 NOTE — Discharge Instructions (Signed)
[facility] to see if they are making appointments for new patients and also the other clinics listed on your discharge papers.  A prescription for your medication was sent to [facility].  Use the good Rx card that was attached to your discharge papers.  The next dose of prednisone will be tomorrow and continue until completely finished.  The albuterol inhaler is every 6 hours as needed for wheezing or shortness of breath.

## 2020-04-23 NOTE — ED Triage Notes (Signed)
Pt invia EMS from home with c/o SOB. EMS reports pt had an asthma attack and does not have an inhaler because he does not have insurance. Pt seen here for the same but left without being seen. 144/78, 100% RA, HR 60's. Pt took his girlfriends kids inhaler and it helped.

## 2020-04-23 NOTE — ED Provider Notes (Signed)
[facility] Emergency Department Provider Note  ____________________________________________   Event Date/Time   First MD Initiated Contact with Patient 04/23/20 [id number]     (approximate)  I have reviewed the triage vital signs and the nursing notes.   HISTORY  Chief Complaint Shortness of Breath and Asthma   HPI Eric Mills is a 24 y.o. male presents to the ED with complaint of asthma.  Patient states that he has had asthma his entire life but currently does not have a doctor and also does not have any inhalers.  He states for the last several days he has been wheezing and used his girlfriend's child's inhaler which helped.  He denies any fever, chills, nausea, vomiting, change of taste and smell or any exposure to known Covid.     Past Medical History:  Diagnosis Date  . Asthma   . Attention deficit disorder (ADD)   . Eczema   . Seasonal allergies     There are no problems to display for this patient.   Past Surgical History:  Procedure Laterality Date  . ANKLE SURGERY      Prior to Admission medications   Medication Sig Start Date End Date Taking? Authorizing Provider  albuterol (VENTOLIN HFA) 108 (90 Base) MCG/ACT inhaler Inhale 2 puffs into the lungs every 6 (six) hours as needed for wheezing or shortness of breath. 04/23/20   Tommi Rumps, PA-C  cetirizine (ZYRTEC) 10 MG tablet Take 1 tablet (10 mg total) by mouth daily. 12/22/18 01/21/19  Menshew, Charlesetta Ivory, PA-C  famotidine (PEPCID) 20 MG tablet Take 1 tablet (20 mg total) by mouth 2 (two) times daily. 12/22/19   Irean Hong, MD  predniSONE (DELTASONE) 20 MG tablet Take 2 tablets once a day for 5 days starting tomorrow 04/23/20   Tommi Rumps, PA-C    Allergies Fish allergy, Milk-related compounds, Shellfish allergy, and Dust mite extract  No family history on file.  Social History Social History   Tobacco Use  . Smoking status: Never Smoker  . Smokeless tobacco:  Never Used  Vaping Use  . Vaping Use: Never used  Substance Use Topics  . Alcohol use: Yes    Comment: occ.  . Drug use: No    Review of Systems Constitutional: No fever/chills Eyes: No visual changes. ENT: No sore throat. Cardiovascular: Denies chest pain. Respiratory: Positive shortness of breath and wheezing.  Negative for cough. Gastrointestinal: No abdominal pain.  No nausea, no vomiting.  Musculoskeletal: Positive for muscle skeletal pain. Skin: Negative for rash. Neurological: Negative for headaches, focal weakness or numbness. ___________________________________________   PHYSICAL EXAM:  VITAL SIGNS: ED Triage Vitals  Enc Vitals Group     BP 04/23/20 0748 136/89     Pulse Rate 04/23/20 0748 72     Resp 04/23/20 0748 20     Temp 04/23/20 0748 97.6 F (36.4 C)     Temp Source 04/23/20 0748 Oral     SpO2 04/23/20 0748 96 %     Weight 04/23/20 0744 205 lb (93 kg)     Height 04/23/20 0744 5\' 9"  (1.753 m)     Head Circumference --      Peak Flow --      Pain Score 04/23/20 0744 0     Pain Loc --      Pain Edu? --      Excl. in GC? --     Constitutional: Alert and oriented. Well appearing and in no acute  distress.  Able to speak in complete sentences without any difficulty. Eyes: Conjunctivae are normal.  Head: Atraumatic. Nose: No congestion/rhinnorhea. Neck: No stridor.   Cardiovascular: Normal rate, regular rhythm. Grossly normal heart sounds.  Good peripheral circulation. Respiratory: Normal respiratory effort.  No retractions. Lungs bilateral expiratory wheeze heard throughout.  Patient continues to be able to speak in complete sentences without any difficulty. Gastrointestinal: Soft and nontender. No distention.  Musculoskeletal: Moves upper and lower extremities without difficulty. Neurologic:  Normal speech and language. No gross focal neurologic deficits are appreciated.  Skin:  Skin is warm, dry and intact. No rash noted. Psychiatric: Mood and affect  are normal. Speech and behavior are normal.  ____________________________________________   LABS (all labs ordered are listed, but only abnormal results are displayed)  Labs Reviewed - No data to display ____________________________________________   PROCEDURES  Procedure(s) performed (including Critical Care):  Procedures   ____________________________________________   INITIAL IMPRESSION / ASSESSMENT AND PLAN / ED COURSE  As part of my medical decision making, I reviewed the following data within the electronic MEDICAL RECORD NUMBER Notes from prior ED visits and [place] Controlled Substance Database  24 year old male presents to the ED with complaint of asthma exacerbation for several days.  Patient states he has had a childhood history of asthma and currently does not have a doctor.  He used his girlfriends child's inhaler and got some relief.  On exam patient has bilateral expiratory wheezes heard throughout with an O2 sat of 96%.  Patient improved with 2 nebulizer treatments and he was given prednisone 60 mg p.o. while in the ED.  Continued prescriptions of prednisone for the next 5 days and inhaler was sent to [facility] pharmacy and a good Rx card was given.  Patient was given a list of clinics in the area [facility]  He was strongly encouraged to call and make an appointment with 1 of these clinics.  At the time of discharge minimal wheezing was noted.  ____________________________________________   FINAL CLINICAL IMPRESSION(S) / ED DIAGNOSES  Final diagnoses:  Mild asthma with exacerbation, unspecified whether persistent     ED Discharge Orders         Ordered    predniSONE (DELTASONE) 20 MG tablet        04/23/20 0831    albuterol (VENTOLIN HFA) 108 (90 Base) MCG/ACT inhaler  Every 6 hours PRN        04/23/20 0831          *Please note:  [name] was evaluated in Emergency Department on 04/23/2020 for the symptoms described in the history  of present illness. He was evaluated in the context of the global COVID-19 pandemic, which necessitated consideration that the patient might be at risk for infection with the SARS-CoV-2 virus that causes COVID-19. Institutional protocols and algorithms that pertain to the evaluation of patients at risk for COVID-19 are in a state of rapid change based on information released by regulatory bodies including the CDC and federal and state organizations. These policies and algorithms were followed during the patient's care in the ED.  Some ED evaluations and interventions may be delayed as a result of limited staffing during and the pandemic.*   Note:  This document was prepared using Dragon voice recognition software and may include unintentional dictation errors.   Tommi Rumps, PA-C 04/23/20 7124    Shaune Pollack, MD 04/23/20 [id number]

## 2020-06-27 ENCOUNTER — Emergency Department: Payer: Self-pay

## 2020-06-27 ENCOUNTER — Other Ambulatory Visit: Payer: Self-pay

## 2020-06-27 ENCOUNTER — Emergency Department
Admission: EM | Admit: 2020-06-27 | Discharge: 2020-06-27 | Disposition: A | Discharge status: Home / Self Care | Payer: Self-pay | Attending: Emergency Medicine | Admitting: Emergency Medicine

## 2020-06-27 DIAGNOSIS — J4521 Mild intermittent asthma with (acute) exacerbation: Secondary | ICD-10-CM | POA: Insufficient documentation

## 2020-06-27 MED ORDER — ALBUTEROL SULFATE HFA 108 (90 BASE) MCG/ACT IN AERS: 2.0000 | INHALATION_SPRAY | Freq: Four times a day (QID) | RESPIRATORY_TRACT | 0 refills | Status: DC | PRN

## 2020-06-27 MED ORDER — ALBUTEROL SULFATE (2.5 MG/3ML) 0.083% IN NEBU
2.5000 mg | INHALATION_SOLUTION | Freq: Once | RESPIRATORY_TRACT | Status: AC
  Administered 2020-06-27: 2.5 mg via RESPIRATORY_TRACT
  Filled 2020-06-27: qty 3

## 2020-06-27 MED ORDER — DEXAMETHASONE SODIUM PHOSPHATE 10 MG/ML IJ SOLN
10.0000 mg | Freq: Once | INTRAMUSCULAR | Status: AC
  Administered 2020-06-27: 10 mg via INTRAMUSCULAR
  Filled 2020-06-27: qty 1

## 2020-06-27 NOTE — Discharge Instructions (Addendum)
You were seen today for cough and shortness of breath.  Your chest x-ray was normal.  You received a steroid injection and an albuterol neb treatment in the ER.  I have refilled your albuterol inhaler.  Please use this every 4-6 hours as needed.

## 2020-06-27 NOTE — ED Triage Notes (Signed)
Pt comes with c/o SOb. Pt speaking in complete sentences and doesn't appear in distress.  Respirations even and unlabored at this time.

## 2020-06-27 NOTE — ED Triage Notes (Signed)
Pt states he has been out of his albuterol inhaler for the past 2 weeks and his asthma has been acting up and needs a refill. Pt is in NAD at present.

## 2020-06-27 NOTE — ED Provider Notes (Signed)
[facility] Emergency Department Provider Note ____________________________________________  Time seen: 1200  I have reviewed the triage vital signs and the nursing notes.  HISTORY  Chief Complaint  Medication Refill   HPI Eric Mills is a 25 y.o. male presents to the ER today with complaint of cough and mild shortness of breath.  He reports this started a few days ago.  He reports the cough is dry nonproductive.  He reports he is short of breath with exertion at times but has a history of exercise-induced asthma and has been out of his albuterol x2 weeks.  He denies headaches, visual changes, runny nose, nasal congestion, ear pain, sore throat, loss of taste/smell or chest pain.  He denies fever, chills or body aches.  He has not taken anything OTC for his symptoms.  He does not smoke.  He denies Covid exposure.    Past Medical History:  Diagnosis Date  . Asthma   . Attention deficit disorder (ADD)   . Eczema   . Seasonal allergies     There are no problems to display for this patient.   Past Surgical History:  Procedure Laterality Date  . ANKLE SURGERY      Prior to Admission medications   Medication Sig Start Date End Date Taking? Authorizing Provider  albuterol (VENTOLIN HFA) 108 (90 Base) MCG/ACT inhaler Inhale 2 puffs into the lungs every 6 (six) hours as needed for wheezing or shortness of breath. 06/27/20   Lorre Munroe, NP  cetirizine (ZYRTEC) 10 MG tablet Take 1 tablet (10 mg total) by mouth daily. 12/22/18 01/21/19  Menshew, Charlesetta Ivory, PA-C  famotidine (PEPCID) 20 MG tablet Take 1 tablet (20 mg total) by mouth 2 (two) times daily. 12/22/19   Irean Hong, MD    Allergies Fish allergy, Milk-related compounds, Shellfish allergy, and Dust mite extract  No family history on file.  Social History Social History   Tobacco Use  . Smoking status: Never Smoker  . Smokeless tobacco: Never Used  Vaping Use  . Vaping Use: Never used   Substance Use Topics  . Alcohol use: Yes    Comment: occ.  . Drug use: No    Review of Systems  Constitutional: Negative for fever, chills or body aches. Eyes: Negative for visual changes. ENT: Negative for runny nose, nasal congestion, ear pain, loss of taste/smell or sore throat. Cardiovascular: Negative for chest pain or chest tightness. Respiratory: Positive for cough and shortness of breath. Gastrointestinal: Negative for nausea, vomiting and diarrhea. Skin: Negative for rash. Neurological: Negative for headaches, dizziness, focal weakness, tingling or numbness. ____________________________________________  PHYSICAL EXAM:  VITAL SIGNS: ED Triage Vitals  Enc Vitals Group     BP 06/27/20 1126 136/79     Pulse Rate 06/27/20 1126 68     Resp 06/27/20 1126 20     Temp 06/27/20 1126 98.4 F (36.9 C)     Temp Source 06/27/20 1126 Oral     SpO2 06/27/20 1126 98 %     Weight 06/27/20 1127 210 lb (95.3 kg)     Height 06/27/20 1127 5\' 9"  (1.753 m)     Head Circumference --      Peak Flow --      Pain Score 06/27/20 1130 0     Pain Loc --      Pain Edu? --      Excl. in GC? --     Constitutional: Alert and oriented.  Obese, in no distress.  Head: Normocephalic and atraumatic. Hematological/Lymphatic/Immunological: No cervical lymphadenopathy. Cardiovascular: Normal rate, regular rhythm.  Respiratory: Normal respiratory effort.  Bilateral expiratory wheezing noted.  No rales/rhonchi. Neurologic:  Normal speech and language. No gross focal neurologic deficits are appreciated. Skin:  Skin is warm, dry and intact. No rash noted.  ____________________________________________   RADIOLOGY   Imaging Orders     DG Chest 2 View IMPRESSION:  No acute process in the chest.    ____________________________________________    INITIAL IMPRESSION / ASSESSMENT AND PLAN / ED COURSE  Cough, SOB:  DDx include viral URI with cough, viral pneumonia, bacterial pneumonia, acute  bronchitis, asthma exacerbation  Chest xray negative Decadron 10 mg IM x 1 Albuterol nebulizer treatment x 1- expiratory wheezing resolved RX for Albuterol 1-2 puffs Q4-6H prn Work note provided Return precautions discussed    ____________________________________________  FINAL CLINICAL IMPRESSION(S) / ED DIAGNOSES  Final diagnoses:  Mild intermittent asthma with exacerbation      Lorre Munroe, NP 06/27/20 1254    Concha Se, MD 06/27/20 1407

## 2020-06-27 NOTE — ED Notes (Signed)
See triage note  Presents requesting a refill of his inhaler  States ran out about 2 weeks ago  Has had occasional cough with some wheezing  No fever

## 2020-07-03 ENCOUNTER — Emergency Department
Admission: EM | Admit: 2020-07-03 | Discharge: 2020-07-03 | Disposition: A | Discharge status: Home / Self Care | Payer: Self-pay | Attending: Emergency Medicine | Admitting: Emergency Medicine

## 2020-07-03 ENCOUNTER — Other Ambulatory Visit: Payer: Self-pay

## 2020-07-03 ENCOUNTER — Emergency Department: Payer: Self-pay

## 2020-07-03 ENCOUNTER — Encounter: Payer: Self-pay | Admitting: Emergency Medicine

## 2020-07-03 DIAGNOSIS — Z7951 Long term (current) use of inhaled steroids: Secondary | ICD-10-CM | POA: Insufficient documentation

## 2020-07-03 DIAGNOSIS — J4521 Mild intermittent asthma with (acute) exacerbation: Secondary | ICD-10-CM | POA: Insufficient documentation

## 2020-07-03 MED ORDER — PREDNISONE 10 MG PO TABS: 10.0000 mg | ORAL_TABLET | Freq: Every day | ORAL | 0 refills | Status: DC

## 2020-07-03 MED ORDER — FLUTICASONE PROPIONATE 50 MCG/ACT NA SUSP: 1.0000 | Freq: Two times a day (BID) | NASAL | 0 refills | Status: AC

## 2020-07-03 MED ORDER — DEXAMETHASONE SODIUM PHOSPHATE 10 MG/ML IJ SOLN
10.0000 mg | Freq: Once | INTRAMUSCULAR | Status: AC
  Administered 2020-07-03: 10 mg via INTRAMUSCULAR
  Filled 2020-07-03: qty 1

## 2020-07-03 MED ORDER — CETIRIZINE HCL 10 MG PO TABS: 10.0000 mg | ORAL_TABLET | Freq: Every day | ORAL | 0 refills | Status: AC

## 2020-07-03 MED ORDER — IPRATROPIUM-ALBUTEROL 0.5-2.5 (3) MG/3ML IN SOLN
6.0000 mL | Freq: Once | RESPIRATORY_TRACT | Status: AC
  Administered 2020-07-03: 6 mL via RESPIRATORY_TRACT
  Filled 2020-07-03: qty 3

## 2020-07-03 NOTE — ED Triage Notes (Signed)
Pt to ED from home c/o SOB for a week.  States hx of asthma and using inhalers at home with relief for a couple hours but then difficulty returns.  Pt with non productive cough.  Pt A&Ox4, chest rise even and symmetrical, in NAD at this time.

## 2020-07-03 NOTE — ED Notes (Signed)
Patient transported to X-ray with technician

## 2020-07-03 NOTE — ED Provider Notes (Signed)
[facility] Emergency Department Provider Note  ____________________________________________  Time seen: Approximately 8:50 PM  I have reviewed the triage vital signs and the nursing notes.   HISTORY  Chief Complaint Asthma    HPI Eric Mills is a 25 y.o. male who presents the emergency department complaining of asthma exacerbation x10 days.  Patient been seen a week ago when he was 3 days into symptoms, had received a shot of steroid for his asthma.  He states that this improved somewhat but then the asthma returned in full force.  He has had no fevers or chills, URI symptoms.  He does have a history of seasonal allergies but does not take any medications for same.  His only complaint is wheezing and chest tightness.  He states that the albuterol improves symptoms for 3 to 4 hours then it returns.  Patient has never been intubated for same.  States that he has a nebulized machine as well as inhalers at home.         Past Medical History:  Diagnosis Date  . Asthma   . Attention deficit disorder (ADD)   . Eczema   . Seasonal allergies     There are no problems to display for this patient.   Past Surgical History:  Procedure Laterality Date  . ANKLE SURGERY      Prior to Admission medications   Medication Sig Start Date End Date Taking? Authorizing Provider  cetirizine (ZYRTEC) 10 MG tablet Take 1 tablet (10 mg total) by mouth daily. 07/03/20  Yes Cuthriell, Delorise Royals, PA-C  fluticasone (FLONASE) 50 MCG/ACT nasal spray Place 1 spray into both nostrils 2 (two) times daily. 07/03/20  Yes Cuthriell, Delorise Royals, PA-C  predniSONE (DELTASONE) 10 MG tablet Take 1 tablet (10 mg total) by mouth daily. 07/03/20  Yes Cuthriell, Delorise Royals, PA-C  albuterol (VENTOLIN HFA) 108 (90 Base) MCG/ACT inhaler Inhale 2 puffs into the lungs every 6 (six) hours as needed for wheezing or shortness of breath. 06/27/20   Lorre Munroe, NP  famotidine (PEPCID) 20 MG tablet Take  1 tablet (20 mg total) by mouth 2 (two) times daily. 12/22/19   Irean Hong, MD    Allergies Fish allergy, Milk-related compounds, Shellfish allergy, and Dust mite extract  History reviewed. No pertinent family history.  Social History Social History   Tobacco Use  . Smoking status: Never Smoker  . Smokeless tobacco: Never Used  Vaping Use  . Vaping Use: Some days  Substance Use Topics  . Alcohol use: Yes    Comment: occ.  . Drug use: No     Review of Systems  Constitutional: No fever/chills Eyes: No visual changes. No discharge ENT: No upper respiratory complaints. Cardiovascular: no chest pain. Respiratory: no cough.  Intermittent shortness of breath and wheezing consistent with asthma exacerbation Gastrointestinal: No abdominal pain.  No nausea, no vomiting.  No diarrhea.  No constipation. Musculoskeletal: Negative for musculoskeletal pain. Skin: Negative for rash, abrasions, lacerations, ecchymosis. Neurological: Negative for headaches, focal weakness or numbness.  10 System ROS otherwise negative.  ____________________________________________   PHYSICAL EXAM:  VITAL SIGNS: ED Triage Vitals  Enc Vitals Group     BP 07/03/20 2006 (!) 164/84     Pulse Rate 07/03/20 2006 76     Resp 07/03/20 2006 18     Temp 07/03/20 2006 98.2 F (36.8 C)     Temp Source 07/03/20 2006 Oral     SpO2 07/03/20 2006 97 %  Weight 07/03/20 2007 210 lb (95.3 kg)     Height 07/03/20 2007 5\' 8"  (1.727 m)     Head Circumference --      Peak Flow --      Pain Score 07/03/20 2007 9     Pain Loc --      Pain Edu? --      Excl. in GC? --      Constitutional: Alert and oriented. Well appearing and in no acute distress. Eyes: Conjunctivae are normal. PERRL. EOMI. Head: Atraumatic. ENT:      Ears:       Nose: No congestion/rhinnorhea.      Mouth/Throat: Mucous membranes are moist.  Neck: No stridor.    Cardiovascular: Normal rate, regular rhythm. Normal S1 and S2.  Good  peripheral circulation. Respiratory: Normal respiratory effort without tachypnea or retractions. Lungs with some mild expiratory wheezing in the lower lung fields.  No expiratory wheezing.  No rales or rhonchi.2008 air entry to the bases with no decreased or absent breath sounds. Musculoskeletal: Full range of motion to all extremities. No gross deformities appreciated. Neurologic:  Normal speech and language. No gross focal neurologic deficits are appreciated.  Skin:  Skin is warm, dry and intact. No rash noted. Psychiatric: Mood and affect are normal. Speech and behavior are normal. Patient exhibits appropriate insight and judgement.   ____________________________________________   LABS (all labs ordered are listed, but only abnormal results are displayed)  Labs Reviewed - No data to display ____________________________________________  EKG   ____________________________________________  RADIOLOGY I personally viewed and evaluated these images as part of my medical decision making, as well as reviewing the written report by the radiologist.  ED Provider Interpretation: No visualization of acute cardiopulmonary process  DG Chest 2 View  Result Date: 07/03/2020 CLINICAL DATA:  Asthma exacerbation EXAM: CHEST - 2 VIEW COMPARISON:  06/27/2020 FINDINGS: The heart size and mediastinal contours are within normal limits. Both lungs are clear. The visualized skeletal structures are unremarkable. IMPRESSION: No active cardiopulmonary disease. Electronically Signed   By: 06/29/2020 M.D.   On: 07/03/2020 21:22    ____________________________________________    PROCEDURES  Procedure(s) performed:    Procedures    Medications  ipratropium-albuterol (DUONEB) 0.5-2.5 (3) MG/3ML nebulizer solution 6 mL (6 mLs Nebulization Given 07/03/20 2103)  dexamethasone (DECADRON) injection 10 mg (10 mg Intramuscular Given 07/03/20 2102)      ____________________________________________   INITIAL IMPRESSION / ASSESSMENT AND PLAN / ED COURSE  Pertinent labs & imaging results that were available during my care of the patient were reviewed by me and considered in my medical decision making (see chart for details).  Review of the [place] CSRS was performed in accordance of the [facility] prior to dispensing any controlled drugs.           Patient's diagnosis is consistent with asthma exacerbation.  Patient presented to the emergency department with asthma symptoms.  He had no other accompanying symptoms.  Chest x-ray was reassuring with no evidence of consolidation concerning for pneumonia.  Patient had improvement after injection of steroid into DuoNeb treatments.  At this time I will treat the patient with prednisone taper, allergy medications to improve patient's asthma exacerbation.  Return precautions discussed with the patient.  No further work-up at this time..  Differential included asthma exacerbation, Covid, URI, pneumonia.  Patient is given ED precautions to return to the ED for any worsening or new symptoms.     ____________________________________________  FINAL CLINICAL IMPRESSION(S) /  ED DIAGNOSES  Final diagnoses:  Mild intermittent asthma with exacerbation      NEW MEDICATIONS STARTED DURING THIS VISIT:  ED Discharge Orders         Ordered    predniSONE (DELTASONE) 10 MG tablet  Daily       Note to Pharmacy: Take 6 pills x 2 days, 5 pills x 2 days, 4 pills x 2 days, 3 pills x 2 days, 2 pills x 2 days, and 1 pill x 2 days   07/03/20 2304    fluticasone (FLONASE) 50 MCG/ACT nasal spray  2 times daily        07/03/20 2304    cetirizine (ZYRTEC) 10 MG tablet  Daily        07/03/20 2304              This chart was dictated using voice recognition software/Dragon. Despite best efforts to proofread, errors can occur which can change the meaning. Any change was purely unintentional.    Racheal Patches, PA-C 07/03/20 2304    Concha Se, MD 07/04/20 1500

## 2021-03-18 ENCOUNTER — Other Ambulatory Visit: Payer: Self-pay

## 2021-03-18 DIAGNOSIS — J4521 Mild intermittent asthma with (acute) exacerbation: Secondary | ICD-10-CM | POA: Insufficient documentation

## 2021-03-18 DIAGNOSIS — Z7951 Long term (current) use of inhaled steroids: Secondary | ICD-10-CM | POA: Insufficient documentation

## 2021-03-18 MED ORDER — IPRATROPIUM-ALBUTEROL 0.5-2.5 (3) MG/3ML IN SOLN: 3.0000 mL | Freq: Once | RESPIRATORY_TRACT | Status: AC

## 2021-03-18 MED ORDER — ALBUTEROL SULFATE HFA 108 (90 BASE) MCG/ACT IN AERS: 2.0000 | INHALATION_SPRAY | RESPIRATORY_TRACT | Status: DC | PRN

## 2021-03-18 MED ORDER — IPRATROPIUM-ALBUTEROL 0.5-2.5 (3) MG/3ML IN SOLN
3.0000 mL | Freq: Once | RESPIRATORY_TRACT | Status: AC
  Administered 2021-03-18: 3 mL via RESPIRATORY_TRACT

## 2021-03-18 MED ORDER — IPRATROPIUM-ALBUTEROL 0.5-2.5 (3) MG/3ML IN SOLN
RESPIRATORY_TRACT | Status: AC
  Administered 2021-03-18: 3 mL via RESPIRATORY_TRACT
  Filled 2021-03-18: qty 3

## 2021-03-18 MED ORDER — IPRATROPIUM-ALBUTEROL 0.5-2.5 (3) MG/3ML IN SOLN
RESPIRATORY_TRACT | Status: AC
  Filled 2021-03-18: qty 3

## 2021-03-18 NOTE — ED Notes (Addendum)
Pt reports improvement of breathing. Wheezing and dyspnea notably improved from initial assessment. Bilateral inspiratory wheeze auscultated.

## 2021-03-18 NOTE — ED Notes (Signed)
Pt states improved s/s from previous assessment. Lung sounds clear bilaterally. Pt placed in ED waiting room.

## 2021-03-18 NOTE — ED Triage Notes (Addendum)
Pt states he has been out of rescue inhaler in a month and started having asthma today. Audible inspiratory wheeze noted bilaterally. Pt able to speak full sentences, pt dyspneic at rest. Pt is AOX4.

## 2021-03-19 ENCOUNTER — Emergency Department
Admission: EM | Admit: 2021-03-19 | Discharge: 2021-03-19 | Disposition: A | Discharge status: Home / Self Care | Payer: Self-pay | Attending: Emergency Medicine | Admitting: Emergency Medicine

## 2021-03-19 DIAGNOSIS — J4521 Mild intermittent asthma with (acute) exacerbation: Secondary | ICD-10-CM

## 2021-03-19 MED ORDER — DEXAMETHASONE 10 MG/ML FOR PEDIATRIC ORAL USE
10.0000 mg | Freq: Once | INTRAMUSCULAR | Status: AC
  Administered 2021-03-19: 10 mg via ORAL
  Filled 2021-03-19: qty 1

## 2021-03-19 MED ORDER — ALBUTEROL SULFATE HFA 108 (90 BASE) MCG/ACT IN AERS: INHALATION_SPRAY | RESPIRATORY_TRACT | 2 refills | Status: DC

## 2021-03-19 NOTE — ED Provider Notes (Signed)
[facility] Emergency Department Provider Note  ____________________________________________   None    (approximate)  I have reviewed the triage vital signs and the nursing notes.   HISTORY  Chief Complaint Asthma    HPI Eric Mills is a 25 y.o. male whose medical history includes asthma and eczema as well as seasonal allergies.  He said he has had asthma since he was a child but he has no doctor and has run out of his albuterol inhaler.  He had a cute onset of shortness of breath and wheezing a few hours ago after work.  He has no medicine to make him feel better and he felt like it was slowly getting worse over time.  It became at least moderate in severity.  He received 2 breathing treatments in the emergency department and now feels better.  Nothing in particular made it worse.  No recent fever, and he denies chest pain, abdominal pain, nausea, and vomiting.  No sore throat.     Past Medical History:  Diagnosis Date   Asthma    Attention deficit disorder (ADD)    Eczema    Seasonal allergies     There are no problems to display for this patient.   Past Surgical History:  Procedure Laterality Date   ANKLE SURGERY      Prior to Admission medications   Medication Sig Start Date End Date Taking? Authorizing Provider  albuterol (VENTOLIN HFA) 108 (90 Base) MCG/ACT inhaler Inhale 2-4 puffs by mouth every 4 hours as needed for wheezing, cough, and/or shortness of breath 03/19/21  Yes Loleta Rose, MD  cetirizine (ZYRTEC) 10 MG tablet Take 1 tablet (10 mg total) by mouth daily. 07/03/20   Cuthriell, Delorise Royals, PA-C  famotidine (PEPCID) 20 MG tablet Take 1 tablet (20 mg total) by mouth 2 (two) times daily. 12/22/19   Irean Hong, MD  fluticasone (FLONASE) 50 MCG/ACT nasal spray Place 1 spray into both nostrils 2 (two) times daily. 07/03/20   Cuthriell, Delorise Royals, PA-C    Allergies Fish allergy, Milk-related compounds, Shellfish allergy, and Dust  mite extract  History reviewed. No pertinent family history.  Social History Social History   Tobacco Use   Smoking status: Never   Smokeless tobacco: Never  Vaping Use   Vaping Use: Some days  Substance Use Topics   Alcohol use: Yes    Comment: occ.   Drug use: No    Review of Systems Constitutional: No fever/chills Eyes: No visual changes. ENT: No sore throat. Cardiovascular: Denies chest pain. Respiratory: Positive for shortness of breath and wheezing. Gastrointestinal: No abdominal pain.  No nausea, no vomiting.  No diarrhea.  No constipation. Genitourinary: Negative for dysuria. Musculoskeletal: Negative for neck pain.  Negative for back pain. Integumentary: Negative for rash. Neurological: Negative for headaches, focal weakness or numbness.   ____________________________________________   PHYSICAL EXAM:  VITAL SIGNS: ED Triage Vitals  Enc Vitals Group     BP 03/18/21 2257 134/80     Pulse Rate 03/18/21 2257 66     Resp 03/18/21 2257 (!) 24     Temp 03/18/21 2257 97.7 F (36.5 C)     Temp Source 03/18/21 2257 Oral     SpO2 03/18/21 2257 98 %     Weight 03/18/21 2258 102.1 kg (225 lb)     Height 03/18/21 2258 1.753 m (5\' 9" )     Head Circumference --      Peak Flow --  Pain Score 03/18/21 2303 2     Pain Loc --      Pain Edu? --      Excl. in [place]? --     Constitutional: Alert and oriented.  Nose: Mild congestion/rhinnorhea. Mouth/Throat: Patient is wearing a mask. Neck: No stridor.  No meningeal signs.   Cardiovascular: Normal rate, regular rhythm. Good peripheral circulation. Respiratory: Normal respiratory effort.  No retractions.  Mild persistent end expiratory wheezes but with good air movement. Neurologic:  Normal speech and language. No gross focal neurologic deficits are appreciated.  Skin:  Skin is warm, dry and intact. Psychiatric: Mood and affect are normal. Speech and behavior are  normal.  ____________________________________________    INITIAL IMPRESSION / MDM / ASSESSMENT AND PLAN / ED COURSE  As part of my medical decision making, I reviewed the following data within the electronic MEDICAL RECORD NUMBER Nursing notes reviewed and incorporated, Old chart reviewed, and Notes from prior ED visits   Differential diagnosis includes, but is not limited to, asthma exacerbation, viral illness, pneumonia.  Given the patient's atopic history, symptoms are very consistent with mild asthma exacerbation.  Feels much better after 2 DuoNeb's.  Reassuring physical exam and vital signs.  No indication for imaging.  I provided a dose of Decadron 10 mg by mouth rather than a short course of prednisone as I think this will be appropriate and helpful for him so that he does not have to buy another prescription.  I explained I cannot provide an albuterol inhaler but I provided him with a prescription and a GoodRx card.  He is comfortable with the plan for outpatient follow-up and will pick up the prescription for the inhaler in the morning.  I gave him the [facility].  I gave my usual and customary return precautions.           ____________________________________________  FINAL CLINICAL IMPRESSION(S) / ED DIAGNOSES  Final diagnoses:  Mild intermittent asthma with exacerbation     MEDICATIONS GIVEN DURING THIS VISIT:  Medications  albuterol (VENTOLIN HFA) 108 (90 Base) MCG/ACT inhaler 2 puff (has no administration in time range)  ipratropium-albuterol (DUONEB) 0.5-2.5 (3) MG/3ML nebulizer solution 3 mL (3 mLs Nebulization Given 03/18/21 2306)  ipratropium-albuterol (DUONEB) 0.5-2.5 (3) MG/3ML nebulizer solution 3 mL (3 mLs Nebulization Given 03/18/21 2313)  dexamethasone (DECADRON) 10 MG/ML injection for Pediatric ORAL use 10 mg (10 mg Oral Given 03/19/21 0114)     ED Discharge Orders          Ordered    albuterol  (VENTOLIN HFA) 108 (90 Base) MCG/ACT inhaler       Note to Pharmacy: Pharmacy may substitute brand and size for insurance-approved equivalent   03/19/21 0104             Note:  This document was prepared using Dragon voice recognition software and may include unintentional dictation errors.   Loleta Rose, MD 03/19/21 [phone]

## 2021-05-28 ENCOUNTER — Other Ambulatory Visit: Payer: Self-pay

## 2021-05-28 ENCOUNTER — Emergency Department
Admission: EM | Admit: 2021-05-28 | Discharge: 2021-05-28 | Disposition: A | Discharge status: Home / Self Care | Payer: Self-pay | Attending: Emergency Medicine | Admitting: Emergency Medicine

## 2021-05-28 DIAGNOSIS — J029 Acute pharyngitis, unspecified: Secondary | ICD-10-CM | POA: Insufficient documentation

## 2021-05-28 DIAGNOSIS — J4521 Mild intermittent asthma with (acute) exacerbation: Secondary | ICD-10-CM | POA: Insufficient documentation

## 2021-05-28 DIAGNOSIS — Z79899 Other long term (current) drug therapy: Secondary | ICD-10-CM | POA: Insufficient documentation

## 2021-05-28 DIAGNOSIS — Z20822 Contact with and (suspected) exposure to covid-19: Secondary | ICD-10-CM | POA: Insufficient documentation

## 2021-05-28 LAB — GROUP A STREP BY PCR: Group A Strep by PCR: NOT DETECTED

## 2021-05-28 LAB — RESP PANEL BY RT-PCR (FLU A&B, COVID) ARPGX2
Influenza A by PCR: NEGATIVE
Influenza B by PCR: NEGATIVE
SARS Coronavirus 2 by RT PCR: NEGATIVE

## 2021-05-28 MED ORDER — BENZONATATE 100 MG PO CAPS: 100.0000 mg | ORAL_CAPSULE | Freq: Three times a day (TID) | ORAL | 0 refills | Status: DC | PRN

## 2021-05-28 MED ORDER — IPRATROPIUM-ALBUTEROL 0.5-2.5 (3) MG/3ML IN SOLN
6.0000 mL | Freq: Once | RESPIRATORY_TRACT | Status: AC
  Administered 2021-05-28: 6 mL via RESPIRATORY_TRACT
  Filled 2021-05-28: qty 3

## 2021-05-28 MED ORDER — PSEUDOEPH-BROMPHEN-DM 30-2-10 MG/5ML PO SYRP: 10.0000 mL | ORAL_SOLUTION | Freq: Four times a day (QID) | ORAL | 0 refills | Status: DC | PRN

## 2021-05-28 MED ORDER — IPRATROPIUM-ALBUTEROL 0.5-2.5 (3) MG/3ML IN SOLN
3.0000 mL | Freq: Once | RESPIRATORY_TRACT | Status: AC
  Administered 2021-05-28: 3 mL via RESPIRATORY_TRACT
  Filled 2021-05-28: qty 3

## 2021-05-28 MED ORDER — METHYLPREDNISOLONE SODIUM SUCC 125 MG IJ SOLR
125.0000 mg | Freq: Once | INTRAMUSCULAR | Status: AC
  Administered 2021-05-28: 125 mg via INTRAMUSCULAR
  Filled 2021-05-28: qty 2

## 2021-05-28 MED ORDER — PREDNISONE 50 MG PO TABS: 50.0000 mg | ORAL_TABLET | Freq: Every day | ORAL | 0 refills | Status: DC

## 2021-05-28 NOTE — ED Triage Notes (Signed)
Pt to ED via POV with c/o his asthma kicking, cough, sore throat and nasal congestion. This has been going on for a couple of days it has just gotten worse today. He is able to speak in complete clear sentences. Pt is wheezing in bilat bases.

## 2021-05-28 NOTE — ED Provider Notes (Signed)
[facility] Provider Note  Patient Contact: 9:52 PM (approximate)   History   Asthma, Sore Throat, and Nasal Congestion   HPI  Eric Mills is a 26 y.o. male who presents the emergency department complaining of shortness of breath, asthma exacerbation after having URI symptoms for the past 1 to 2 weeks.  No fevers or chills currently, no chest pain though he did endorse some "chest tightness" with his asthma.  Patient received a DuoNeb treatment from triage and states that his symptoms feel much better after this treatment.  Patient with no GI symptoms.     Physical Exam   Triage Vital Signs: ED Triage Vitals [05/28/21 2056]  Enc Vitals Group     BP 140/71     Pulse Rate 70     Resp 20     Temp 98.1 F (36.7 C)     Temp Source Oral     SpO2 97 %     Weight 220 lb (99.8 kg)     Height 5\' 9"  (1.753 m)     Head Circumference      Peak Flow      Pain Score 10     Pain Loc      Pain Edu?      Excl. in [place]?     Most recent vital signs: Vitals:   05/28/21 2056  BP: 140/71  Pulse: 70  Resp: 20  Temp: 98.1 F (36.7 C)  SpO2: 97%     General: Alert and in no acute distress. ENT:      Ears:       Nose: No congestion/rhinnorhea.      Mouth/Throat: Mucous membranes are moist.  No gross oropharyngeal erythema or edema.  No exudates.  No uvular deviation. Neck: No stridor. No cervical spine tenderness to palpation.  Cardiovascular:  Good peripheral perfusion Respiratory: Normal respiratory effort without tachypnea or retractions. Lungs with expiratory wheezing bilateral lung fields.  No expiratory wheezing.Kermit Balo air entry to the bases with no decreased or absent breath sounds. Musculoskeletal: Full range of motion to all extremities.  Neurologic:  No gross focal neurologic deficits are appreciated.  Skin:   No rash noted Other:   ED Results / Procedures / Treatments   Labs (all labs ordered are listed, but only abnormal results are  displayed) Labs Reviewed  RESP PANEL BY RT-PCR (FLU A&B, COVID) ARPGX2  GROUP A STREP BY PCR     EKG     RADIOLOGY    No results found.  PROCEDURES:  Critical Care performed: No  Procedures   MEDICATIONS ORDERED IN ED: Medications  ipratropium-albuterol (DUONEB) 0.5-2.5 (3) MG/3ML nebulizer solution 3 mL (3 mLs Nebulization Given 05/28/21 2104)  ipratropium-albuterol (DUONEB) 0.5-2.5 (3) MG/3ML nebulizer solution 6 mL (6 mLs Nebulization Given 05/28/21 2206)  methylPREDNISolone sodium succinate (SOLU-MEDROL) 125 mg/2 mL injection 125 mg (125 mg Intramuscular Given 05/28/21 2206)     IMPRESSION / MDM / ASSESSMENT AND PLAN / ED COURSE  I reviewed the triage vital signs and the nursing notes.                              Differential diagnosis includes, but is not limited to, COVID, influenza, viral URI, asthma exacerbation, bronchitis   Patient's diagnosis is consistent with patient presented to the emergency department with asthma exacerbation.  He states that he has a history of asthma, had some URI-like symptoms preceding  his asthma exacerbation.  Patient arrived with expiratory wheezing in bilateral lung fields.  No expiratory wheezing.  No rales or rhonchi.  After 3 DuoNeb treatments and Solu-Medrol patient has no wheezing, no work of breathing.  Suspect viral illness precipitating his asthma though he was negative for COVID and influenza.  At this time patiently placed on steroids, cough medication..  Follow-up primary care as needed.  Return precautions discussed with the patient.. Patient is given ED precautions to return to the ED for any worsening or new symptoms.        FINAL CLINICAL IMPRESSION(S) / ED DIAGNOSES   Final diagnoses:  Mild intermittent asthma with exacerbation     Rx / DC Orders   ED Discharge Orders          Ordered    predniSONE (DELTASONE) 50 MG tablet  Daily with breakfast        05/28/21 2339    brompheniramine-pseudoephedrine-DM  30-2-10 MG/5ML syrup  4 times daily PRN        05/28/21 2339    benzonatate (TESSALON PERLES) 100 MG capsule  3 times daily PRN        05/28/21 2339             Note:  This document was prepared using Dragon voice recognition software and may include unintentional dictation errors.   Brynda Peon 05/28/21 2339    Lavonia Drafts, MD 05/30/21 [phone]

## 2021-11-04 ENCOUNTER — Encounter: Payer: Self-pay | Admitting: Emergency Medicine

## 2021-11-04 ENCOUNTER — Other Ambulatory Visit: Payer: Self-pay

## 2021-11-04 ENCOUNTER — Emergency Department
Admission: EM | Admit: 2021-11-04 | Discharge: 2021-11-04 | Disposition: A | Discharge status: Home / Self Care | Payer: Self-pay | Attending: Emergency Medicine | Admitting: Emergency Medicine

## 2021-11-04 DIAGNOSIS — J4521 Mild intermittent asthma with (acute) exacerbation: Secondary | ICD-10-CM | POA: Insufficient documentation

## 2021-11-04 MED ORDER — PREDNISONE 50 MG PO TABS: 50.0000 mg | ORAL_TABLET | Freq: Every day | ORAL | 0 refills | Status: AC

## 2021-11-04 MED ORDER — PREDNISONE 20 MG PO TABS
60.0000 mg | ORAL_TABLET | Freq: Once | ORAL | Status: AC
  Administered 2021-11-04: 60 mg via ORAL
  Filled 2021-11-04: qty 3

## 2021-11-04 MED ORDER — IPRATROPIUM-ALBUTEROL 0.5-2.5 (3) MG/3ML IN SOLN
3.0000 mL | Freq: Once | RESPIRATORY_TRACT | Status: AC
  Administered 2021-11-04: 3 mL via RESPIRATORY_TRACT
  Filled 2021-11-04: qty 3

## 2021-11-04 NOTE — ED Triage Notes (Signed)
Pt to ED via POV, states sore throat x 2 days, pt states asthma flare up, no relief with rescue inhaler at this time. Pt A&O x4, ambulatory without difficulty to triage desk. Able to speak in full and complete sentences without difficulty.

## 2021-11-04 NOTE — Discharge Instructions (Addendum)
-  Continue to take your prednisone and utilize your rescue inhaler as needed.  -Please follow-up with your primary care provider/pulmonologist as discussed.  -Return to the emergency department anytime if you begin to experience any new or worsening symptoms.

## 2021-11-04 NOTE — ED Provider Notes (Signed)
[facility] Provider Note    None    (approximate)   History   Chief Complaint Sore Throat and Asthma   HPI Eric Mills is a 26 y.o. male, history of asthma, ADD, eczema, seasonal allergies, presents to the emergency department for evaluation of sore throat, cough, and SOB x 2 days.  Patient states that he has had several asthma flareups in the past and believes he is having an asthma flareup this time as well, with no relief from his rescue inhaler.  Denies fever/chills, chest pain, abdominal pain, flank pain, nausea/vomiting, diarrhea, dysphagia, headache, dizziness/lightheadedness,  History Limitations: No limitations        Physical Exam  Triage Vital Signs: ED Triage Vitals  Enc Vitals Group     BP 11/04/21 0705 (!) 148/79     Pulse Rate 11/04/21 0705 68     Resp 11/04/21 0705 18     Temp 11/04/21 0705 98.3 F (36.8 C)     Temp Source 11/04/21 0705 Oral     SpO2 11/04/21 0705 97 %     Weight --      Height --      Head Circumference --      Peak Flow --      Pain Score 11/04/21 0702 8     Pain Loc --      Pain Edu? --      Excl. in [place]? --     Most recent vital signs: Vitals:   11/04/21 0705  BP: (!) 148/79  Pulse: 68  Resp: 18  Temp: 98.3 F (36.8 C)  SpO2: 97%    General: Awake, NAD.  Skin: Warm, dry. No rashes or lesions.  Eyes: PERRL. Conjunctivae normal.  CV: Good peripheral perfusion.  Resp: Normal effort.  Significant wheezing bilaterally in the bases. Abd: Soft, non-tender. No distention.  Neuro: At baseline. No gross neurological deficits.   Focused Exam: Throat exam unremarkable, no tonsillar swelling, exudates, or erythema.  Uvula midline.  Physical Exam    ED Results / Procedures / Treatments  Labs (all labs ordered are listed, but only abnormal results are displayed) Labs Reviewed - No data to display   EKG N/A.   RADIOLOGY  ED Provider Interpretation: N/A.  No results  found.  PROCEDURES:  Critical Care performed: N/A.  Procedures    MEDICATIONS ORDERED IN ED: Medications  ipratropium-albuterol (DUONEB) 0.5-2.5 (3) MG/3ML nebulizer solution 3 mL (3 mLs Nebulization Given 11/04/21 0720)  predniSONE (DELTASONE) tablet 60 mg (60 mg Oral Given 11/04/21 0719)     IMPRESSION / MDM / ASSESSMENT AND PLAN / ED COURSE  I reviewed the triage vital signs and the nursing notes.                              Differential diagnosis includes, but is not limited to, viral URI, asthma exacerbation, bronchitis.  ED Course Patient appears well, vitals within normal limits.  No hypoxia present.  We will go ahead treat with DuoNeb and prednisone 60 mg p.o.  On reexamination, wheezing has significantly improved.  Assessment/Plan Presentation consistent with asthma exacerbation, likely secondary to viral respiratory illness.  Very low suspicion for pneumonia given his presentation, chest x-ray not warranted.  He responded well to DuoNeb and prednisone treatment here.  We will plan to discharge him with a short course of prednisone.  Per chart review, he has had multiple visits for asthma exacerbations.  Advised him to follow-up with his primary care provider/pulmonologist for ongoing care.  Patient's presentation is most consistent with acute, uncomplicated illness.   Provided the patient with anticipatory guidance, return precautions, and educational material. Encouraged the patient to return to the emergency department at any time if they begin to experience any new or worsening symptoms. Patient expressed understanding and agreed with the plan.       FINAL CLINICAL IMPRESSION(S) / ED DIAGNOSES   Final diagnoses:  Mild intermittent asthma with exacerbation     Rx / DC Orders   ED Discharge Orders          Ordered    predniSONE (DELTASONE) 50 MG tablet  Daily with breakfast        11/04/21 2518             Note:  This document was prepared using  Dragon voice recognition software and may include unintentional dictation errors.   Varney Daily, [place] 11/04/21 9842    Arnaldo Natal, MD 11/04/21 [phone]

## 2022-02-03 ENCOUNTER — Encounter: Payer: Self-pay | Admitting: Emergency Medicine

## 2022-02-03 ENCOUNTER — Other Ambulatory Visit: Payer: Self-pay

## 2022-02-03 ENCOUNTER — Emergency Department
Admission: EM | Admit: 2022-02-03 | Discharge: 2022-02-03 | Disposition: A | Discharge status: Home / Self Care | Payer: Self-pay | Attending: Emergency Medicine | Admitting: Emergency Medicine

## 2022-02-03 DIAGNOSIS — J4541 Moderate persistent asthma with (acute) exacerbation: Secondary | ICD-10-CM | POA: Insufficient documentation

## 2022-02-03 MED ORDER — PREDNISONE 20 MG PO TABS
60.0000 mg | ORAL_TABLET | Freq: Once | ORAL | Status: AC
  Administered 2022-02-03: 60 mg via ORAL
  Filled 2022-02-03: qty 3

## 2022-02-03 MED ORDER — ALBUTEROL SULFATE HFA 108 (90 BASE) MCG/ACT IN AERS: INHALATION_SPRAY | RESPIRATORY_TRACT | 2 refills | Status: DC

## 2022-02-03 MED ORDER — PREDNISONE 10 MG PO TABS: 50.0000 mg | ORAL_TABLET | Freq: Every day | ORAL | 0 refills | Status: DC

## 2022-02-03 MED ORDER — IPRATROPIUM-ALBUTEROL 0.5-2.5 (3) MG/3ML IN SOLN
3.0000 mL | Freq: Once | RESPIRATORY_TRACT | Status: AC
  Administered 2022-02-03: 3 mL via RESPIRATORY_TRACT
  Filled 2022-02-03: qty 3

## 2022-02-03 NOTE — ED Provider Notes (Signed)
[facility] Provider Note    Event Date/Time   First MD Initiated Contact with Patient 02/03/22 1242     (approximate)   History   Shortness of Breath   HPI  Eric Mills is a 26 y.o. male  with history of asthma presents to the ER for due to asthma exacerbation.  Patient states that he ran out of his inhaler yesterday.  He feels that symptoms have been triggered by change in season and seasonal allergies.  He has had no fever or other concerning symptoms.  He states on average at baseline he uses his inhaler at least every other day.  He currently has no primary care provider or pulmonologist.     Physical Exam   Triage Vital Signs: ED Triage Vitals  Enc Vitals Group     BP 02/03/22 1217 (!) 150/40     Pulse Rate 02/03/22 1217 (!) 57     Resp 02/03/22 1217 18     Temp 02/03/22 1217 97.7 F (36.5 C)     Temp Source 02/03/22 1217 Oral     SpO2 02/03/22 1217 98 %     Weight 02/03/22 1213 218 lb 4.1 oz (99 kg)     Height 02/03/22 1213 5\' 9"  (1.753 m)     Head Circumference --      Peak Flow --      Pain Score 02/03/22 1213 0     Pain Loc --      Pain Edu? --      Excl. in [place]? --     Most recent vital signs: Vitals:   02/03/22 1217  BP: (!) 150/40  Pulse: (!) 57  Resp: 18  Temp: 97.7 F (36.5 C)  SpO2: 98%     General: Awake, no distress.  CV:  Good peripheral perfusion.  Resp:  Normal effort.  Expiratory wheeze noted throughout Abd:  No distention.  Other:     ED Results / Procedures / Treatments   Labs (all labs ordered are listed, but only abnormal results are displayed) Labs Reviewed - No data to display   EKG  Not indicated   RADIOLOGY Not indicated.    PROCEDURES:  Critical Care performed: No  Procedures   MEDICATIONS ORDERED IN ED: Medications  ipratropium-albuterol (DUONEB) 0.5-2.5 (3) MG/3ML nebulizer solution 3 mL (3 mLs Nebulization Given 02/03/22 1315)  predniSONE (DELTASONE) tablet 60 mg (60 mg  Oral Given 02/03/22 1315)     IMPRESSION / MDM / ASSESSMENT AND PLAN / ED COURSE  I reviewed the triage vital signs and the nursing notes.                              Differential diagnosis includes, but is not limited to, asthma exacerbation, Covid, URI  Patient's presentation is most consistent with exacerbation of chronic illness.  26 year old male presents to the emergency department for treatment and evaluation of asthma exacerbation and is out of albuterol.  On exam, he has diffuse wheezing.  Plan will be to give him a dose of prednisone and DuoNeb while here then send refills to the pharmacy for his albuterol.  He will also [facility].  He was encouraged to call to schedule an appointment to establish primary care so they can get his asthma better controlled by adjusting his medications.  Patient is agreeable to this plan.     FINAL CLINICAL  IMPRESSION(S) / ED DIAGNOSES   Final diagnoses:  Moderate persistent asthma with acute exacerbation     Rx / DC Orders   ED Discharge Orders          Ordered    predniSONE (DELTASONE) 10 MG tablet  Daily        02/03/22 1313    albuterol (VENTOLIN HFA) 108 (90 Base) MCG/ACT inhaler       Note to Pharmacy: Pharmacy may substitute brand and size for insurance-approved equivalent   02/03/22 1313             Note:  This document was prepared using Dragon voice recognition software and may include unintentional dictation errors.   Chinita Pester, FNP 02/03/22 1332    Pilar Jarvis, MD 02/04/22 [phone]

## 2022-02-03 NOTE — ED Triage Notes (Signed)
Pt reports his asthma has been acting up for the last couple of days. Pt reports he feels short of breath and has to use his inhaler more. Pt reports some nasal congestion but denies other sx's.

## 2022-05-21 ENCOUNTER — Emergency Department: Payer: Medicaid Other

## 2022-05-21 ENCOUNTER — Emergency Department
Admission: EM | Admit: 2022-05-21 | Discharge: 2022-05-21 | Disposition: A | Discharge status: Home / Self Care | Payer: Medicaid Other | Attending: Emergency Medicine | Admitting: Emergency Medicine

## 2022-05-21 ENCOUNTER — Other Ambulatory Visit: Payer: Self-pay

## 2022-05-21 DIAGNOSIS — R0602 Shortness of breath: Secondary | ICD-10-CM | POA: Diagnosis not present

## 2022-05-21 DIAGNOSIS — R001 Bradycardia, unspecified: Secondary | ICD-10-CM | POA: Diagnosis not present

## 2022-05-21 DIAGNOSIS — J4521 Mild intermittent asthma with (acute) exacerbation: Secondary | ICD-10-CM | POA: Insufficient documentation

## 2022-05-21 DIAGNOSIS — Z1152 Encounter for screening for COVID-19: Secondary | ICD-10-CM | POA: Insufficient documentation

## 2022-05-21 LAB — RESP PANEL BY RT-PCR (RSV, FLU A&B, COVID)  RVPGX2
Influenza A by PCR: NEGATIVE
Influenza B by PCR: NEGATIVE
Resp Syncytial Virus by PCR: NEGATIVE
SARS Coronavirus 2 by RT PCR: NEGATIVE

## 2022-05-21 MED ORDER — ALBUTEROL SULFATE HFA 108 (90 BASE) MCG/ACT IN AERS: 2.0000 | INHALATION_SPRAY | Freq: Four times a day (QID) | RESPIRATORY_TRACT | 2 refills | Status: DC | PRN

## 2022-05-21 NOTE — ED Provider Notes (Signed)
[facility] Provider Note    Event Date/Time   First MD Initiated Contact with Patient 05/21/22 1207     (approximate)   History   Shortness of Breath (Asthma)   HPI  Eric Mills is a 27 y.o. male with history of asthma and ADD presents emergency department complaining of some shortness of breath on exertion.  Patient states he has had a lot of wheezing.  He does have a cough and did have vomiting 2 days ago.  No vomiting now.  No diarrhea.  Patient states he thinks his asthma has flared and he is out of an albuterol inhaler      Physical Exam   Triage Vital Signs: ED Triage Vitals [05/21/22 1151]  Enc Vitals Group     BP (!) 140/91     Pulse Rate 64     Resp 18     Temp 98.7 F (37.1 C)     Temp src      SpO2 97 %     Weight      Height      Head Circumference      Peak Flow      Pain Score      Pain Loc      Pain Edu?      Excl. in [place]?     Most recent vital signs: Vitals:   05/21/22 1151  BP: (!) 140/91  Pulse: 64  Resp: 18  Temp: 98.7 F (37.1 C)  SpO2: 97%     General: Awake, no distress.   CV:  Good peripheral perfusion. regular rate and  rhythm Resp:  Normal effort. Lungs cta Abd:  No distention.   Other:      ED Results / Procedures / Treatments   Labs (all labs ordered are listed, but only abnormal results are displayed) Labs Reviewed  RESP PANEL BY RT-PCR (RSV, FLU A&B, COVID)  RVPGX2     EKG     RADIOLOGY Chest x-ray    PROCEDURES:   Procedures   MEDICATIONS ORDERED IN ED: Medications - No data to display   IMPRESSION / MDM / [facility] / ED COURSE  I reviewed the triage vital signs and the nursing notes.                              Differential diagnosis includes, but is not limited to, asthma, COVID, RSV, influenza, CAP  Patient's presentation is most consistent with acute complicated illness / injury requiring diagnostic workup.   Respiratory panel chest x-ray, EKG    EKG shows sinus bradycardia, no STEMI, no prolonged Qt, see physician read  Chest x-ray independently reviewed and interpreted by me as being negative  Respiratory panel is reassuring  I did explain everything to the patient.  He is given a prescription for an albuterol inhaler.  Given a work note.  He is to follow-up with his regular doctor if not improving 3 days.  Return if worsening.  He is in agreement treatment plan.  Discharged in stable condition.    FINAL CLINICAL IMPRESSION(S) / ED DIAGNOSES   Final diagnoses:  Mild intermittent asthma with exacerbation     Rx / DC Orders   ED Discharge Orders          Ordered    albuterol (VENTOLIN HFA) 108 (90 Base) MCG/ACT inhaler  Every 6 hours PRN  05/21/22 1317             Note:  This document was prepared using Dragon voice recognition software and may include unintentional dictation errors.    Versie Starks, PA-C 05/21/22 1319    Lucillie Garfinkel, MD 05/22/22 [phone]

## 2022-05-21 NOTE — ED Triage Notes (Addendum)
Pt to ED via POV from home. Pt ambulatory to triage. Pt reports SOB and feels like asthma attack that started this morning. Pt also reports CP with inspiration.

## 2022-06-16 NOTE — Progress Notes (Deleted)
New Patient Note  RE: Eric Mills MRN: [id number] DOB: 1995-06-03 Date of Office Visit: 06/17/2022  Consult requested by: No ref. provider found Primary care provider: Pcp, No  Chief Complaint: No chief complaint on file.  History of Present Illness: I had the pleasure of seeing [name] for initial evaluation at the [facility] on 06/16/2022. He is a 27 y.o. male, who is referred here by Pcp, No for the evaluation of asthma.  He reports symptoms of *** chest tightness, shortness of breath, coughing, wheezing, nocturnal awakenings for *** years. Current medications include *** which help. He reports *** using aerochamber with inhalers. He tried the following inhalers: ***. Main triggers are ***allergies, infections, weather changes, smoke, exercise, pet exposure. In the last month, frequency of symptoms: ***x/week. Frequency of nocturnal symptoms: ***x/month. Frequency of SABA use: ***x/week. Interference with physical activity: ***. Sleep is ***disturbed. In the last 12 months, emergency room visits/urgent care visits/doctor office visits or hospitalizations due to respiratory issues: ***. In the last 12 months, oral steroids courses: ***. Lifetime history of hospitalization for respiratory issues: ***. Prior intubations: ***. Asthma was diagnosed at age *** by ***. History of pneumonia: ***. He was evaluated by allergist ***pulmonologist in the past. Smoking exposure: ***. Up to date with flu vaccine: ***. Up to date with pneumonia vaccine: ***. Up to date with COVID-19 vaccine: ***. Prior Covid-19 infection: ***. History of reflux: ***.  05/21/2022 ER visit: "Sterling Behning is a 27 y.o. male with history of asthma and ADD presents emergency department complaining of some shortness of breath on exertion.  Patient states he has had a lot of wheezing.  He does have a cough and did have vomiting 2 days ago.  No vomiting now.  No diarrhea.  Patient states he thinks his asthma  has flared and he is out of an albuterol inhaler"  05/21/2022 chest x-ray: "IMPRESSION: No active cardiopulmonary disease."  Assessment and Plan: [name] is a 27 y.o. male with: No problem-specific Assessment & Plan notes found for this encounter.  No follow-ups on file.  No orders of the defined types were placed in this encounter.  Lab Orders  No laboratory test(s) ordered today    Other allergy screening: Asthma: {Blank single:19197::"yes","no"} Rhino conjunctivitis: {Blank single:19197::"yes","no"} Food allergy: {Blank single:19197::"yes","no"} Medication allergy: {Blank single:19197::"yes","no"} Hymenoptera allergy: {Blank single:19197::"yes","no"} Urticaria: {Blank single:19197::"yes","no"} Eczema:{Blank single:19197::"yes","no"} History of recurrent infections suggestive of immunodeficency: {Blank single:19197::"yes","no"}  Diagnostics: Spirometry:  Tracings reviewed. His effort: {Blank single:19197::"Good reproducible efforts.","It was hard to get consistent efforts and there is a question as to whether this reflects a maximal maneuver.","Poor effort, data can not be interpreted."} FVC: ***L FEV1: ***L, ***% predicted FEV1/FVC ratio: ***% Interpretation: {Blank single:19197::"Spirometry consistent with mild obstructive disease","Spirometry consistent with moderate obstructive disease","Spirometry consistent with severe obstructive disease","Spirometry consistent with possible restrictive disease","Spirometry consistent with mixed obstructive and restrictive disease","Spirometry uninterpretable due to technique","Spirometry consistent with normal pattern","No overt abnormalities noted given today's efforts"}.  Please see scanned spirometry results for details.  Skin Testing: {Blank single:19197::"Select foods","Environmental allergy panel","Environmental allergy panel and select foods","Food allergy panel","None","Deferred due to recent antihistamines use"}. *** Results  discussed with patient/family.   Past Medical History: There are no problems to display for this patient.  Past Medical History:  Diagnosis Date   Asthma    Attention deficit disorder (ADD)    Eczema    Seasonal allergies    Past Surgical History: Past Surgical History:  Procedure Laterality Date   ANKLE SURGERY  Medication List:  Current Outpatient Medications  Medication Sig Dispense Refill   albuterol (VENTOLIN HFA) 108 (90 Base) MCG/ACT inhaler Inhale 2 puffs into the lungs every 6 (six) hours as needed for wheezing or shortness of breath. 8 g 2   cetirizine (ZYRTEC) 10 MG tablet Take 1 tablet (10 mg total) by mouth daily. 30 tablet 0   famotidine (PEPCID) 20 MG tablet Take 1 tablet (20 mg total) by mouth 2 (two) times daily. 8 tablet 0   fluticasone (FLONASE) 50 MCG/ACT nasal spray Place 1 spray into both nostrils 2 (two) times daily. 16 g 0   No current facility-administered medications for this visit.   Allergies: Allergies  Allergen Reactions   Fish Allergy Anaphylaxis and Swelling   Milk-Related Compounds Anaphylaxis and Swelling   Shellfish Allergy Anaphylaxis and Swelling   Dust Mite Extract    Social History: Social History   Socioeconomic History   Marital status: Single    Spouse name: Not on file   Number of children: Not on file   Years of education: Not on file   Highest education level: Not on file  Occupational History   Not on file  Tobacco Use   Smoking status: Never   Smokeless tobacco: Never  Vaping Use   Vaping Use: Some days  Substance and Sexual Activity   Alcohol use: Yes    Comment: occ.   Drug use: No   Sexual activity: Yes  Other Topics Concern   Not on file  Social History Narrative   Not on file   Social Determinants of Health   Financial Resource Strain: Not on file  Food Insecurity: Not on file  Transportation Needs: Not on file  Physical Activity: Not on file  Stress: Not on file  Social Connections: Not on  file   Lives in a ***. Smoking: *** Occupation: ***  Environmental HistoryFreight forwarder in the house: Estate agent in the family room: {Blank single:19197::"yes","no"} Carpet in the bedroom: {Blank single:19197::"yes","no"} Heating: {Blank single:19197::"electric","gas","heat pump"} Cooling: {Blank single:19197::"central","window","heat pump"} Pet: {Blank single:19197::"yes ***","no"}  Family History: No family history on file. Problem                               Relation Asthma                                   *** Eczema                                *** Food allergy                          *** Allergic rhino conjunctivitis     ***  Review of Systems  Constitutional:  Negative for appetite change, chills, fever and unexpected weight change.  HENT:  Negative for congestion and rhinorrhea.   Eyes:  Negative for itching.  Respiratory:  Negative for cough, chest tightness, shortness of breath and wheezing.   Cardiovascular:  Negative for chest pain.  Gastrointestinal:  Negative for abdominal pain.  Genitourinary:  Negative for difficulty urinating.  Skin:  Negative for rash.  Neurological:  Negative for headaches.    Objective: There were no vitals taken for this visit. There is no height or weight on file  to calculate BMI. Physical Exam Vitals and nursing note reviewed.  Constitutional:      Appearance: Normal appearance. He is well-developed.  HENT:     Head: Normocephalic and atraumatic.     Right Ear: Tympanic membrane and external ear normal.     Left Ear: Tympanic membrane and external ear normal.     Nose: Nose normal.     Mouth/Throat:     Mouth: Mucous membranes are moist.     Pharynx: Oropharynx is clear.  Eyes:     Conjunctiva/sclera: Conjunctivae normal.  Cardiovascular:     Rate and Rhythm: Normal rate and regular rhythm.     Heart sounds: Normal heart sounds. No murmur heard.    No friction rub. No gallop.   Pulmonary:     Effort: Pulmonary effort is normal.     Breath sounds: Normal breath sounds. No wheezing, rhonchi or rales.  Musculoskeletal:     Cervical back: Neck supple.  Skin:    General: Skin is warm.     Findings: No rash.  Neurological:     Mental Status: He is alert and oriented to person, place, and time.  Psychiatric:        Behavior: Behavior normal.    The plan was reviewed with the patient/family, and all questions/concerned were addressed.  It was my pleasure to see Rivers today and participate in his care. Please feel free to contact me with any questions or concerns.  Sincerely,  Rexene Alberts, DO Allergy & Immunology  [facility] office: [place] office: [phone]

## 2022-06-17 ENCOUNTER — Ambulatory Visit: Payer: Medicaid Other | Admitting: Allergy

## 2022-07-11 ENCOUNTER — Encounter: Payer: Self-pay | Admitting: Allergy

## 2022-07-11 ENCOUNTER — Ambulatory Visit: Payer: Medicaid Other | Admitting: Allergy

## 2022-10-30 ENCOUNTER — Emergency Department: Payer: 59

## 2022-10-30 ENCOUNTER — Other Ambulatory Visit: Payer: Self-pay

## 2022-10-30 ENCOUNTER — Emergency Department
Admission: EM | Admit: 2022-10-30 | Discharge: 2022-10-30 | Disposition: A | Discharge status: Home / Self Care | Payer: 59 | Attending: Emergency Medicine | Admitting: Emergency Medicine

## 2022-10-30 DIAGNOSIS — R062 Wheezing: Secondary | ICD-10-CM | POA: Diagnosis not present

## 2022-10-30 DIAGNOSIS — R0602 Shortness of breath: Secondary | ICD-10-CM | POA: Diagnosis not present

## 2022-10-30 DIAGNOSIS — J4521 Mild intermittent asthma with (acute) exacerbation: Secondary | ICD-10-CM | POA: Insufficient documentation

## 2022-10-30 DIAGNOSIS — Z20822 Contact with and (suspected) exposure to covid-19: Secondary | ICD-10-CM | POA: Diagnosis not present

## 2022-10-30 LAB — CBC
HCT: 43.8 % (ref 39.0–52.0)
Hemoglobin: 14.8 g/dL (ref 13.0–17.0)
MCH: 28.4 pg (ref 26.0–34.0)
MCHC: 33.8 g/dL (ref 30.0–36.0)
MCV: 83.9 fL (ref 80.0–100.0)
Platelets: 243 10*3/uL (ref 150–400)
RBC: 5.22 MIL/uL (ref 4.22–5.81)
RDW: 11.6 % (ref 11.5–15.5)
WBC: 5.4 10*3/uL (ref 4.0–10.5)
nRBC: 0 % (ref 0.0–0.2)

## 2022-10-30 LAB — COMPREHENSIVE METABOLIC PANEL
ALT: 15 U/L (ref 0–44)
AST: 18 U/L (ref 15–41)
Albumin: 4.6 g/dL (ref 3.5–5.0)
Alkaline Phosphatase: 67 U/L (ref 38–126)
Anion gap: 9 (ref 5–15)
BUN: 19 mg/dL (ref 6–20)
CO2: 27 mmol/L (ref 22–32)
Calcium: 9.5 mg/dL (ref 8.9–10.3)
Chloride: 101 mmol/L (ref 98–111)
Creatinine, Ser: 1.01 mg/dL (ref 0.61–1.24)
GFR, Estimated: >60 mL/min (ref >60)
Glucose, Bld: 100 mg/dL — ABNORMAL HIGH (ref 70–99)
Potassium: 3.9 mmol/L (ref 3.5–5.1)
Sodium: 137 mmol/L (ref 135–145)
Total Bilirubin: 0.9 mg/dL (ref 0.3–1.2)
Total Protein: 8.4 g/dL — ABNORMAL HIGH (ref 6.5–8.1)

## 2022-10-30 LAB — RESP PANEL BY RT-PCR (RSV, FLU A&B, COVID)  RVPGX2
Influenza A by PCR: NEGATIVE
Influenza B by PCR: NEGATIVE
Resp Syncytial Virus by PCR: NEGATIVE
SARS Coronavirus 2 by RT PCR: NEGATIVE

## 2022-10-30 MED ORDER — ALBUTEROL SULFATE HFA 108 (90 BASE) MCG/ACT IN AERS: 2.0000 | INHALATION_SPRAY | Freq: Four times a day (QID) | RESPIRATORY_TRACT | 12 refills | Status: AC | PRN

## 2022-10-30 MED ORDER — PREDNISONE 10 MG (21) PO TBPK: ORAL_TABLET | ORAL | 0 refills | Status: AC

## 2022-10-30 NOTE — ED Provider Notes (Signed)
[facility] Provider Note    Event Date/Time   First MD Initiated Contact with Patient 10/30/22 [phone]     (approximate)   History   Shortness of Breath   HPI  Eric Mills is a 27 y.o. male with history of asthma, ADD, eczema presents emergency department via EMS due to wheezing.  Patient was given albuterol nebulizer on the EMS truck.  States he did feel better afterwards.  Patient is out of his albuterol inhaler.  States that he feels much better after the treatment does not feel that he needs another but does need a refill on his medicine.  No fever or chills.  Is not coughing up any discolored mucus.      Physical Exam   Triage Vital Signs: ED Triage Vitals  Enc Vitals Group     BP 10/30/22 0809 136/80     Pulse Rate 10/30/22 0809 79     Resp 10/30/22 0809 18     Temp 10/30/22 0809 98.1 F (36.7 C)     Temp Source 10/30/22 0809 Oral     SpO2 10/30/22 0809 98 %     Weight 10/30/22 0810 220 lb (99.8 kg)     Height 10/30/22 0810 5\' 9"  (1.753 m)     Head Circumference --      Peak Flow --      Pain Score 10/30/22 0810 0     Pain Loc --      Pain Edu? --      Excl. in GC? --     Most recent vital signs: Vitals:   10/30/22 0809  BP: 136/80  Pulse: 79  Resp: 18  Temp: 98.1 F (36.7 C)  SpO2: 98%     General: Awake, no distress.   CV:  Good peripheral perfusion. regular rate and  rhythm Resp:  Normal effort. Lungs CTA Abd:  No distention.   Other:      ED Results / Procedures / Treatments   Labs (all labs ordered are listed, but only abnormal results are displayed) Labs Reviewed  COMPREHENSIVE METABOLIC PANEL - Abnormal; Notable for the following components:      Result Value   Glucose, Bld 100 (*)    Total Protein 8.4 (*)    All other components within normal limits  RESP PANEL BY RT-PCR (RSV, FLU A&B, COVID)  RVPGX2  CBC     EKG     RADIOLOGY Chest x-ray    PROCEDURES:   Procedures   MEDICATIONS  ORDERED IN ED: Medications - No data to display   IMPRESSION / MDM / ASSESSMENT AND PLAN / ED COURSE  I reviewed the triage vital signs and the nursing notes.                              Differential diagnosis includes, but is not limited to, COVID, CAP, acute URI, asthma exasperation, respiratory distress  Patient's presentation is most consistent with exacerbation of chronic illness.   Patient's labs are reassuring, chest x-ray independently reviewed and interpreted by me as being negative for any acute abnormality specifically no pneumonia  I did explain all the findings to the patient.  His exam is benign.  Feel that we can discharge him with a prescription for his albuterol inhaler along with a steroid taper.  He is to follow-up with his regular doctor if not improving to 3 days.  Return the emergency department  if worsening.  He is in agreement treatment plan.  Was discharged stable condition.      FINAL CLINICAL IMPRESSION(S) / ED DIAGNOSES   Final diagnoses:  Mild intermittent asthma with exacerbation     Rx / DC Orders   ED Discharge Orders          Ordered    predniSONE (STERAPRED UNI-PAK 21 TAB) 10 MG (21) TBPK tablet        10/30/22 0914    albuterol (VENTOLIN HFA) 108 (90 Base) MCG/ACT inhaler  Every 6 hours PRN        10/30/22 0914             Note:  This document was prepared using Dragon voice recognition software and may include unintentional dictation errors.    Faythe Ghee, PA-C 10/30/22 1121    Sharman Cheek, MD 10/31/22 [phone]

## 2022-10-30 NOTE — Discharge Instructions (Addendum)
Please go to the following website to schedule new (and existing) patient appointments:   [email or web address]   The following is a list of primary care offices in the area who are accepting new patients at this time.  Please reach out to one of them directly and let them know you would like to schedule an appointment to follow up on an Emergency Department visit, and/or to establish a new primary care provider (PCP).  There are likely other primary care clinics in the are who are accepting new patients, but this is an excellent place to start:  [facility] Lead physician: Dr Angela Bacigalupo [address] [place], [place] [address] [phone]  [facility] Lead Physician: Dr Krichna Sowles [address], [place], [place] [address] [phone]  [facility]  Lead Physician: Dr Megan Johnson [address], [place], [place] [address] [phone]  [facility] [place] [facility] Lead Physician: Dr Alex Karamalegos [address], [place], [place] [address] [phone]  [facility] [place] Lead Physician: Dr Laura Berglund [address], [place], [place] [address] [phone]   

## 2022-10-30 NOTE — ED Triage Notes (Addendum)
Ems reports that the pt called out for sob, pt was ambulatory to the truck and had wheezing, pt was given an albuterol neb, ems reports pt cont to have wheezing post treatment, pt states that he feels like the treatment opened him up  62p, 100% ra post treatment, bp 141/100  Pt states hx of asthma and ran out of his inhaler

## 2022-12-14 DIAGNOSIS — J45901 Unspecified asthma with (acute) exacerbation: Secondary | ICD-10-CM | POA: Diagnosis not present

## 2022-12-14 DIAGNOSIS — R079 Chest pain, unspecified: Secondary | ICD-10-CM | POA: Diagnosis not present

## 2022-12-14 DIAGNOSIS — R0600 Dyspnea, unspecified: Secondary | ICD-10-CM | POA: Diagnosis not present

## 2022-12-14 DIAGNOSIS — R062 Wheezing: Secondary | ICD-10-CM | POA: Diagnosis not present

## 2022-12-14 DIAGNOSIS — Z79899 Other long term (current) drug therapy: Secondary | ICD-10-CM | POA: Diagnosis not present

## 2022-12-14 DIAGNOSIS — R071 Chest pain on breathing: Secondary | ICD-10-CM | POA: Diagnosis not present

## 2022-12-14 DIAGNOSIS — R1110 Vomiting, unspecified: Secondary | ICD-10-CM | POA: Diagnosis not present

## 2022-12-14 DIAGNOSIS — R9431 Abnormal electrocardiogram [ECG] [EKG]: Secondary | ICD-10-CM | POA: Diagnosis not present

## 2022-12-14 DIAGNOSIS — R0602 Shortness of breath: Secondary | ICD-10-CM | POA: Diagnosis not present

## 2023-01-28 DIAGNOSIS — J069 Acute upper respiratory infection, unspecified: Secondary | ICD-10-CM | POA: Diagnosis not present

## 2023-01-28 DIAGNOSIS — J45901 Unspecified asthma with (acute) exacerbation: Secondary | ICD-10-CM | POA: Diagnosis not present

## 2023-01-28 DIAGNOSIS — R509 Fever, unspecified: Secondary | ICD-10-CM | POA: Diagnosis not present

## 2023-01-28 DIAGNOSIS — J029 Acute pharyngitis, unspecified: Secondary | ICD-10-CM | POA: Diagnosis not present

## 2023-06-09 ENCOUNTER — Emergency Department (HOSPITAL_BASED_OUTPATIENT_CLINIC_OR_DEPARTMENT_OTHER): Payer: 59 | Admitting: Radiology

## 2023-06-09 ENCOUNTER — Emergency Department: Admission: EM | Admit: 2023-06-09 | Discharge: 2023-06-09 | Discharge status: Against Medical Advice | Payer: 59

## 2023-06-09 ENCOUNTER — Emergency Department (HOSPITAL_BASED_OUTPATIENT_CLINIC_OR_DEPARTMENT_OTHER)
Admission: EM | Admit: 2023-06-09 | Discharge: 2023-06-09 | Disposition: A | Discharge status: Home / Self Care | Payer: 59 | Attending: Emergency Medicine | Admitting: Emergency Medicine

## 2023-06-09 ENCOUNTER — Encounter (HOSPITAL_BASED_OUTPATIENT_CLINIC_OR_DEPARTMENT_OTHER): Payer: Self-pay | Admitting: *Deleted

## 2023-06-09 ENCOUNTER — Other Ambulatory Visit: Payer: Self-pay

## 2023-06-09 DIAGNOSIS — Y92410 Unspecified street and highway as the place of occurrence of the external cause: Secondary | ICD-10-CM | POA: Insufficient documentation

## 2023-06-09 DIAGNOSIS — R0789 Other chest pain: Secondary | ICD-10-CM | POA: Diagnosis present

## 2023-06-09 MED ORDER — METHOCARBAMOL 500 MG PO TABS: 500.0000 mg | ORAL_TABLET | Freq: Two times a day (BID) | ORAL | 0 refills | Status: AC

## 2023-06-09 MED ORDER — NAPROXEN 375 MG PO TABS: 375.0000 mg | ORAL_TABLET | Freq: Two times a day (BID) | ORAL | 0 refills | Status: AC

## 2023-06-09 MED ORDER — METHOCARBAMOL 500 MG PO TABS
500.0000 mg | ORAL_TABLET | Freq: Once | ORAL | Status: AC
  Administered 2023-06-09: 500 mg via ORAL
  Filled 2023-06-09: qty 1

## 2023-06-09 MED ORDER — NAPROXEN 250 MG PO TABS
500.0000 mg | ORAL_TABLET | Freq: Once | ORAL | Status: AC
  Administered 2023-06-09: 500 mg via ORAL
  Filled 2023-06-09: qty 2

## 2023-06-09 MED ORDER — LIDOCAINE 5 % EX PTCH
2.0000 | MEDICATED_PATCH | CUTANEOUS | Status: DC
  Administered 2023-06-09: 2 via TRANSDERMAL
  Filled 2023-06-09: qty 2
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
## 2023-06-09 NOTE — Discharge Instructions (Signed)
Thank you for letting us evaluate you today. Your chest XR was negative for obvious fracture. I have sent a muscle relaxer and "strong ibuprofen" for pain. You can expect to have pain for 7-10 days following. You can pick up lidocaine patches and voltaren gel at local pharmacy for topical pain. I have sent robaxin (muscle relaxer) to your pharmacy - do not drink alcohol nor drive with this as it makes you drowsy. I have sent naproxen also - no advil, alleve, ibuprofen with this. You can also take tyelnol with naprosyn and robaxin.  Return to ED if pain significantly worsens

## 2023-06-09 NOTE — ED Notes (Signed)
Pt has c-collar on
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
## 2023-06-25 NOTE — Progress Notes (Deleted)
 [facility] [address] [place] [address] Dept: [phone] Dept Fax: [phone]  New Patient Office Visit  Subjective:   Charle Clear Sep 08, 1995 06/26/2023  No chief complaint on file.   HPI: Eric Mills presents today to establish care at [facility]. Introduced to Publishing rights manager role and practice setting.  All questions answered.  Concerns: See below   Discussed the use of AI scribe software for clinical note transcription with the patient, who gave verbal consent to proceed.  History of Present Illness             The following portions of the patient's history were reviewed and updated as appropriate: past medical history, past surgical history, family history, social history, allergies, medications, and problem list.   There are no active problems to display for this patient.  Past Medical History:  Diagnosis Date   Asthma    Attention deficit disorder (ADD)    Eczema    Seasonal allergies    Past Surgical History:  Procedure Laterality Date   ANKLE SURGERY     No family history on file.  Current Outpatient Medications:    albuterol (VENTOLIN HFA) 108 (90 Base) MCG/ACT inhaler, Inhale 2 puffs into the lungs every 6 (six) hours as needed for wheezing or shortness of breath., Disp: 8 g, Rfl: 12   cetirizine (ZYRTEC) 10 MG tablet, Take 1 tablet (10 mg total) by mouth daily., Disp: 30 tablet, Rfl: 0   famotidine (PEPCID) 20 MG tablet, Take 1 tablet (20 mg total) by mouth 2 (two) times daily., Disp: 8 tablet, Rfl: 0   fluticasone (FLONASE) 50 MCG/ACT nasal spray, Place 1 spray into both nostrils 2 (two) times daily., Disp: 16 g, Rfl: 0   methocarbamol (ROBAXIN) 500 MG tablet, Take 1 tablet (500 mg total) by mouth 2 (two) times daily., Disp: 20 tablet, Rfl: 0   naproxen (NAPROSYN) 375 MG tablet, Take 1 tablet (375 mg total) by mouth 2 (two) times daily., Disp: 20  tablet, Rfl: 0   predniSONE (STERAPRED UNI-PAK 21 TAB) 10 MG (21) TBPK tablet, Take 6 pills on day one then decrease by 1 pill each day, Disp: 21 tablet, Rfl: 0 Allergies  Allergen Reactions   Fish Allergy Anaphylaxis and Swelling   Milk-Related Compounds Anaphylaxis and Swelling   Shellfish Allergy Anaphylaxis and Swelling   Dust Mite Extract     ROS: A complete ROS was performed with pertinent positives/negatives noted in the HPI. The remainder of the ROS are negative.   Objective:   There were no vitals filed for this visit.  GENERAL: Well-appearing, in NAD. Well nourished.  SKIN: Pink, warm and dry. No rash, lesion, ulceration, or ecchymoses.  NECK: Trachea midline. Full ROM w/o pain or tenderness. No lymphadenopathy.  RESPIRATORY: Chest wall symmetrical. Respirations even and non-labored. Breath sounds clear to auscultation bilaterally.  CARDIAC: S1, S2 present, regular rate and rhythm. Peripheral pulses 2+ bilaterally.  MSK: Muscle tone and strength appropriate for age. Joints w/o tenderness, redness, or swelling.  EXTREMITIES: Without clubbing, cyanosis, or edema.  NEUROLOGIC: No motor or sensory deficits. Steady, even gait.  PSYCH/MENTAL STATUS: Alert, oriented x 3. Cooperative, appropriate mood and affect.   Health Maintenance Due  Topic Date Due   COVID-19 Vaccine (1) Never done   Pneumococcal Vaccine 79-4 Years old (1 of 2 - PCV) Never done   HPV VACCINES (1 - Male 3-dose series) Never done   HIV Screening  Never  done   Hepatitis C Screening  Never done   DTaP/Tdap/Td (1 - Tdap) Never done   INFLUENZA VACCINE  12/05/2022    No results found for any visits on 06/26/23.  Assessment & Plan:  Assessment and Plan               There are no diagnoses linked to this encounter.  No orders of the defined types were placed in this encounter.  No orders of the defined types were placed in this encounter.   No follow-ups on file.   Salvatore Decent, FNP

## 2023-06-26 ENCOUNTER — Ambulatory Visit: Payer: 59 | Admitting: Internal Medicine
